# Patient Record
Sex: Female | Born: 1950 | Race: Black or African American | Hispanic: No | Smoking: Former smoker
Health system: Southern US, Community
[De-identification: ages and names within clinical notes are randomized; demographics above are authoritative.]

## PROBLEM LIST (undated history)

## (undated) DIAGNOSIS — I10 Essential (primary) hypertension: Secondary | ICD-10-CM

## (undated) DIAGNOSIS — E119 Type 2 diabetes mellitus without complications: Secondary | ICD-10-CM

## (undated) DIAGNOSIS — E079 Disorder of thyroid, unspecified: Secondary | ICD-10-CM

## (undated) HISTORY — PX: OTHER SURGICAL HISTORY: SHX169

## (undated) HISTORY — DX: Essential (primary) hypertension: I10

## (undated) HISTORY — DX: Disorder of thyroid, unspecified: E07.9

## (undated) HISTORY — DX: Type 2 diabetes mellitus without complications: E11.9

---

## 2019-06-27 LAB — BASIC METABOLIC PANEL
BUN: 35 — AB (ref 4–21)
Creatinine: 1.8 — AB (ref 0.5–1.1)

## 2019-06-27 LAB — LIPID PANEL
Cholesterol: 155 (ref 0–200)
HDL: 57 (ref 35–70)
LDL Cholesterol: 85
Triglycerides: 63 (ref 40–160)

## 2019-06-27 LAB — COMPREHENSIVE METABOLIC PANEL
GFR calc Af Amer: 32
GFR calc non Af Amer: 28

## 2019-06-27 LAB — HEMOGLOBIN A1C: Hemoglobin A1C: 7.6

## 2019-06-27 LAB — TSH: TSH: 7.3 — AB (ref 0.41–5.90)

## 2019-09-11 ENCOUNTER — Encounter: Payer: Self-pay | Admitting: "Endocrinology

## 2019-09-11 ENCOUNTER — Other Ambulatory Visit: Payer: Self-pay

## 2019-09-11 ENCOUNTER — Ambulatory Visit (INDEPENDENT_AMBULATORY_CARE_PROVIDER_SITE_OTHER): Payer: Medicare Other | Admitting: "Endocrinology

## 2019-09-11 VITALS — BP 136/78 | HR 80 | Ht 63.0 in | Wt 175.0 lb

## 2019-09-11 DIAGNOSIS — E1165 Type 2 diabetes mellitus with hyperglycemia: Secondary | ICD-10-CM | POA: Insufficient documentation

## 2019-09-11 DIAGNOSIS — E782 Mixed hyperlipidemia: Secondary | ICD-10-CM

## 2019-09-11 DIAGNOSIS — I10 Essential (primary) hypertension: Secondary | ICD-10-CM

## 2019-09-11 DIAGNOSIS — E032 Hypothyroidism due to medicaments and other exogenous substances: Secondary | ICD-10-CM | POA: Diagnosis not present

## 2019-09-11 LAB — HM DIABETES EYE EXAM

## 2019-09-11 NOTE — Progress Notes (Signed)
Endocrinology Consult Note       09/11/2019, 5:29 PM   Subjective:    Patient ID: Gloria Webb, female    DOB: July 15, 1950.  Gloria Webb is being seen in consultation for management of currently uncontrolled symptomatic diabetes requested by  Grayland Jack, PA-C.   Past Medical History:  Diagnosis Date  . Diabetes mellitus, type II (HCC)   . Hypertension   . Thyroid disease     Past Surgical History:  Procedure Laterality Date  . broken ankle      Social History   Socioeconomic History  . Marital status: Divorced    Spouse name: Not on file  . Number of children: Not on file  . Years of education: Not on file  . Highest education level: Not on file  Occupational History  . Not on file  Tobacco Use  . Smoking status: Never Smoker  Vaping Use  . Vaping Use: Never used  Substance and Sexual Activity  . Alcohol use: Never  . Drug use: Never  . Sexual activity: Not on file  Other Topics Concern  . Not on file  Social History Narrative  . Not on file   Social Determinants of Health   Financial Resource Strain:   . Difficulty of Paying Living Expenses:   Food Insecurity:   . Worried About Programme researcher, broadcasting/film/video in the Last Year:   . Barista in the Last Year:   Transportation Needs:   . Freight forwarder (Medical):   Marland Kitchen Lack of Transportation (Non-Medical):   Physical Activity:   . Days of Exercise per Week:   . Minutes of Exercise per Session:   Stress:   . Feeling of Stress :   Social Connections:   . Frequency of Communication with Friends and Family:   . Frequency of Social Gatherings with Friends and Family:   . Attends Religious Services:   . Active Member of Clubs or Organizations:   . Attends Banker Meetings:   Marland Kitchen Marital Status:     Family History  Problem Relation Age of Onset  . Hypertension Mother     Outpatient Encounter  Medications as of 09/11/2019  Medication Sig  . amLODipine (NORVASC) 5 MG tablet Take 5 mg by mouth daily.  Marland Kitchen atorvastatin (LIPITOR) 20 MG tablet Take 20 mg by mouth daily.  . carvedilol (COREG) 25 MG tablet Take 25 mg by mouth 2 (two) times daily.  . hydrALAZINE (APRESOLINE) 50 MG tablet Take 50 mg by mouth 2 (two) times daily.  Marland Kitchen LEVEMIR FLEXTOUCH 100 UNIT/ML FlexPen Inject 24 Units into the skin daily.  Marland Kitchen losartan (COZAAR) 25 MG tablet Take 25 mg by mouth daily.  Marland Kitchen NOVOLOG FLEXPEN 100 UNIT/ML FlexPen Inject 10 Units into the skin 3 (three) times daily.   No facility-administered encounter medications on file as of 09/11/2019.    ALLERGIES: Allergies  Allergen Reactions  . Methimazole Swelling    Pt states she experienced swelling of her face and tongue    VACCINATION STATUS:  There is no immunization history on file for this patient.  Diabetes She presents  for her initial diabetic visit. She has type 2 diabetes mellitus. Onset time: She was diagnosed at approximate age of 35 years. Her disease course has been fluctuating. There are no hypoglycemic associated symptoms. Pertinent negatives for hypoglycemia include no confusion, headaches, pallor or seizures. Associated symptoms include fatigue and polydipsia. Pertinent negatives for diabetes include no chest pain, no polyphagia and no polyuria. There are no hypoglycemic complications. Symptoms are worsening. Diabetic complications include nephropathy. Risk factors for coronary artery disease include diabetes mellitus, dyslipidemia, family history, obesity, hypertension, sedentary lifestyle and post-menopausal. Current diabetic treatment includes insulin injections (She is currently on Levemir 34 units nightly, NovoLog 10 units 3 times daily AC.). Her weight is fluctuating minimally. She is following a generally unhealthy diet. When asked about meal planning, she reported none. She has not had a previous visit with a dietitian. She never  participates in exercise. (She did not bring any logs nor meter to review.  She reports rare, random hypoglycemia.  Her recent labs show A1c of 7.6%.  She did have A1c as high as 12% in the past.) An ACE inhibitor/angiotensin II receptor blocker is being taken. Eye exam is current.  Hyperlipidemia This is a chronic problem. The current episode started more than 1 year ago. The problem is controlled. Exacerbating diseases include chronic renal disease, diabetes and obesity. Pertinent negatives include no chest pain, myalgias or shortness of breath. Current antihyperlipidemic treatment includes statins. Risk factors for coronary artery disease include diabetes mellitus, hypertension, obesity, a sedentary lifestyle, post-menopausal and family history.  Hypertension This is a chronic problem. The current episode started more than 1 year ago. Pertinent negatives include no chest pain, headaches, palpitations or shortness of breath. Risk factors for coronary artery disease include dyslipidemia, diabetes mellitus, obesity, sedentary lifestyle, family history and post-menopausal state. Past treatments include angiotensin blockers, beta blockers, calcium channel blockers and direct vasodilators. Identifiable causes of hypertension include chronic renal disease.     Review of Systems  Constitutional: Positive for fatigue. Negative for chills, fever and unexpected weight change.  HENT: Negative for trouble swallowing and voice change.   Eyes: Negative for visual disturbance.  Respiratory: Negative for cough, shortness of breath and wheezing.   Cardiovascular: Negative for chest pain, palpitations and leg swelling.  Gastrointestinal: Negative for diarrhea, nausea and vomiting.  Endocrine: Positive for polydipsia. Negative for cold intolerance, heat intolerance, polyphagia and polyuria.  Musculoskeletal: Negative for arthralgias and myalgias.  Skin: Negative for color change, pallor, rash and wound.   Neurological: Negative for seizures and headaches.  Psychiatric/Behavioral: Negative for confusion and suicidal ideas.    Objective:    Vitals with BMI 09/11/2019  Height 5\' 3"   Weight 175 lbs  BMI 31.01  Systolic 136  Diastolic 78  Pulse 80    BP 136/78   Pulse 80   Ht 5\' 3"  (1.6 m)   Wt 175 lb (79.4 kg)   BMI 31.00 kg/m   Wt Readings from Last 3 Encounters:  09/11/19 175 lb (79.4 kg)     Physical Exam Constitutional:      Appearance: She is well-developed.  HENT:     Head: Normocephalic and atraumatic.  Neck:     Thyroid: No thyromegaly.     Trachea: No tracheal deviation.  Cardiovascular:     Rate and Rhythm: Normal rate.  Pulmonary:     Effort: Pulmonary effort is normal.  Abdominal:     Tenderness: There is no abdominal tenderness. There is no guarding.  Musculoskeletal:  General: Normal range of motion.     Cervical back: Normal range of motion and neck supple.  Skin:    General: Skin is warm and dry.     Coloration: Skin is not pale.     Findings: No erythema or rash.  Neurological:     Mental Status: She is alert and oriented to person, place, and time.     Cranial Nerves: No cranial nerve deficit.     Coordination: Coordination normal.     Deep Tendon Reflexes: Reflexes are normal and symmetric.  Psychiatric:        Judgment: Judgment normal.     CMP ( most recent) CMP     Component Value Date/Time   BUN 35 (A) 06/27/2019 0000   CREATININE 1.8 (A) 06/27/2019 0000   GFRNONAA 28 06/27/2019 0000   GFRAA 32 06/27/2019 0000     Diabetic Labs (most recent): Lab Results  Component Value Date   HGBA1C 7.6 06/27/2019     Lipid Panel ( most recent) Lipid Panel     Component Value Date/Time   CHOL 155 06/27/2019 0000   TRIG 63 06/27/2019 0000   HDL 57 06/27/2019 0000   LDLCALC 85 06/27/2019 0000      Lab Results  Component Value Date   TSH 7.30 (A) 06/27/2019           Assessment & Plan:   1. Uncontrolled type 2  diabetes mellitus with hyperglycemia (HCC)  - Gloria Webb has currently uncontrolled symptomatic type 2 DM since  69 years of age,  with most recent A1c of 7.6 %. Recent labs reviewed. - I had a long discussion with her about the progressive nature of diabetes and the pathology behind its complications. -her diabetes is complicated by CKD, obesity/sedentary life and she remains at a high risk for more acute and chronic complications which include CAD, CVA, CKD, retinopathy, and neuropathy. These are all discussed in detail with her.  - I have counseled her on diet  and weight management  by adopting a carbohydrate restricted/protein rich diet. Patient is encouraged to switch to  unprocessed or minimally processed     complex starch and increased protein intake (animal or plant source), fruits, and vegetables. -  she is advised to stick to a routine mealtimes to eat 3 meals  a day and avoid unnecessary snacks ( to snack only to correct hypoglycemia).   - she admits that there is a room for improvement in her food and drink choices. - Suggestion is made for her to avoid simple carbohydrates  from her diet including Cakes, Sweet Desserts, Ice Cream, Soda (diet and regular), Sweet Tea, Candies, Chips, Cookies, Store Bought Juices, Alcohol in Excess of  1-2 drinks a day, Artificial Sweeteners,  Coffee Creamer, and "Sugar-free" Products. This will help patient to have more stable blood glucose profile and potentially avoid unintended weight gain.  - she will be scheduled with Norm Salt, RDN, CDE for diabetes education.  - I have approached her with the following individualized plan to manage  her diabetes and patient agrees:   - she will be continued on adjusted basal/bolus insulin in order for her to achieve and maintain control of diabetes to target. -Accordingly, she is advised to lower her Levemir to 30 units nightly, adjust her NovoLog at 10  units 3 times a day with meals  for pre-meal BG  readings of 90-150mg /dl, plus patient specific correction dose for unexpected hyperglycemia above 150mg /dl, associated with strict  monitoring of glucose 4 times a day-before meals and at bedtime. - she is warned not to take insulin without proper monitoring per orders. - Adjustment parameters are given to her for hypo and hyperglycemia in writing. - she is encouraged to call clinic for blood glucose levels less than 70 or above 300 mg /dl. -She will be considered for low-dose glipizide, or incretin therapy as appropriate during her next visit.  - Specific targets for  A1c;  LDL, HDL,  and Triglycerides were discussed with the patient.  2) Blood Pressure /Hypertension:  her blood pressure is  controlled to target.   she is advised to continue her current medications including losartan 25 mg p.o. daily, carvedilol 25 mg p.o. twice daily, amlodipine 5 mg daily, hydralazine 50 mg p.o. twice daily.    3) Lipids/Hyperlipidemia:   Review of her recent lipid panel showed  controlled  LDL at 85 .  she  is advised to continue    atorvastatin 20 mg daily at bedtime.  Side effects and precautions discussed with her.  4)  Weight/Diet:  Body mass index is 31 kg/m.  -   clearly complicating her diabetes care.   she is  a candidate for weight loss. I discussed with her the fact that loss of 5 - 10% of her  current body weight will have the most impact on her diabetes management.  Exercise, and detailed carbohydrates information provided  -  detailed on discharge instructions.  5) toxic multinodular goiter: -Her medical records were reviewed.  She underwent bilateral fine-needle aspiration of nodules on both lobes of the thyroid with benign findings.  She was treated adequately with antithyroid medications for hyperthyroidism.  Her most recent labs indicate iatrogenic hypothyroidism.  She is advised to stay off of her antithyroid intervention for now with plan to repeat thyroid function test in 5 weeks.  6)  Chronic Care/Health Maintenance:  -she  is on ACEI/ARB and Statin medications and  is encouraged to initiate and continue to follow up with Ophthalmology, Dentist,  Podiatrist at least yearly or according to recommendations, and advised to   stay away from smoking. I have recommended yearly flu vaccine and pneumonia vaccine at least every 5 years; moderate intensity exercise for up to 150 minutes weekly; and  sleep for at least 7 hours a day.  - she is  advised to maintain close follow up with Grayland Jack, PA-C for primary care needs, as well as her other providers for optimal and coordinated care.   - Time spent in this patient care: 60 min, of which > 50% was spent in  counseling  her about her currently uncontrolled type 2 diabetes, toxic multinodular goiter, hyperlipidemia, hypertension and the rest reviewing her blood glucose logs , discussing her hypoglycemia and hyperglycemia episodes, reviewing her current and  previous labs / studies  ( including abstraction from other facilities) and medications  doses and developing a  long term treatment plan based on the latest standards of care/ guidelines; and documenting her care.    Please refer to Patient Instructions for Blood Glucose Monitoring and Insulin/Medications Dosing Guide"  in media tab for additional information. Please  also refer to " Patient Self Inventory" in the Media  tab for reviewed elements of pertinent patient history.  Gloria Webb participated in the discussions, expressed understanding, and voiced agreement with the above plans.  All questions were answered to her satisfaction. she is encouraged to contact clinic should she have any questions or concerns  prior to her return visit.   Follow up plan: - Return in about 5 weeks (around 10/16/2019) for Bring Meter and Logs- A1c in Office.  Gloria LunchGebre Zelta Enfield, MD [facility] [address] [place], [place] [address] Phone:  [phone]  Fax: [phone]    09/11/2019, 5:29 PM  This note was partially dictated with voice recognition software. Similar sounding words can be transcribed inadequately or may not  be corrected upon review.

## 2019-09-11 NOTE — Patient Instructions (Signed)

## 2019-09-17 ENCOUNTER — Ambulatory Visit: Payer: Medicare Other | Admitting: Nurse Practitioner

## 2019-09-17 ENCOUNTER — Other Ambulatory Visit: Payer: Self-pay

## 2019-09-17 DIAGNOSIS — E1165 Type 2 diabetes mellitus with hyperglycemia: Secondary | ICD-10-CM

## 2019-09-17 NOTE — Progress Notes (Signed)
Patient came into office with [facility] Device and sensor, Patient was shown how to apply the sensor, demonstrated proper technique of application, verbalized understanding.

## 2019-10-10 LAB — T4, FREE: Free T4: 1.72 ng/dL (ref 0.82–1.77)

## 2019-10-10 LAB — TSH: TSH: 0.062 u[IU]/mL — ABNORMAL LOW (ref 0.450–4.500)

## 2019-10-10 LAB — T3, FREE: T3, Free: 2.8 pg/mL (ref 2.0–4.4)

## 2019-10-20 ENCOUNTER — Other Ambulatory Visit: Payer: Self-pay

## 2019-10-20 ENCOUNTER — Encounter: Payer: Self-pay | Admitting: "Endocrinology

## 2019-10-20 ENCOUNTER — Ambulatory Visit (INDEPENDENT_AMBULATORY_CARE_PROVIDER_SITE_OTHER): Payer: Medicare Other | Admitting: "Endocrinology

## 2019-10-20 VITALS — BP 118/76 | HR 76 | Ht 63.0 in | Wt 176.4 lb

## 2019-10-20 DIAGNOSIS — E0520 Thyrotoxicosis with toxic multinodular goiter without thyrotoxic crisis or storm: Secondary | ICD-10-CM | POA: Diagnosis not present

## 2019-10-20 DIAGNOSIS — I10 Essential (primary) hypertension: Secondary | ICD-10-CM | POA: Diagnosis not present

## 2019-10-20 DIAGNOSIS — E1165 Type 2 diabetes mellitus with hyperglycemia: Secondary | ICD-10-CM | POA: Diagnosis not present

## 2019-10-20 DIAGNOSIS — E782 Mixed hyperlipidemia: Secondary | ICD-10-CM

## 2019-10-20 LAB — POCT GLYCOSYLATED HEMOGLOBIN (HGB A1C): Hemoglobin A1C: 7.2 % — AB (ref 4.0–5.6)

## 2019-10-20 NOTE — Patient Instructions (Signed)

## 2019-10-20 NOTE — Progress Notes (Signed)
10/20/2019, 2:15 PM   Endocrinology follow-up note  Subjective:    Patient ID: Gloria Webb, female    DOB: 04/27/1950.  Gloria Webb is being seen in follow-up after she was seen in consultation for management of currently uncontrolled symptomatic diabetes requested by  Grayland Jack, PA-C.   Past Medical History:  Diagnosis Date  . Diabetes mellitus, type II (HCC)   . Hypertension   . Thyroid disease     Past Surgical History:  Procedure Laterality Date  . broken ankle      Social History   Socioeconomic History  . Marital status: Divorced    Spouse name: Not on file  . Number of children: Not on file  . Years of education: Not on file  . Highest education level: Not on file  Occupational History  . Not on file  Tobacco Use  . Smoking status: Never Smoker  Vaping Use  . Vaping Use: Never used  Substance and Sexual Activity  . Alcohol use: Never  . Drug use: Never  . Sexual activity: Not on file  Other Topics Concern  . Not on file  Social History Narrative  . Not on file   Social Determinants of Health   Financial Resource Strain:   . Difficulty of Paying Living Expenses: Not on file  Food Insecurity:   . Worried About Programme researcher, broadcasting/film/video in the Last Year: Not on file  . Ran Out of Food in the Last Year: Not on file  Transportation Needs:   . Lack of Transportation (Medical): Not on file  . Lack of Transportation (Non-Medical): Not on file  Physical Activity:   . Days of Exercise per Week: Not on file  . Minutes of Exercise per Session: Not on file  Stress:   . Feeling of Stress : Not on file  Social Connections:   . Frequency of Communication with Friends and Family: Not on file  . Frequency of Social Gatherings with Friends and Family: Not on file  . Attends Religious Services: Not on file  . Active Member of Clubs or Organizations: Not on file  . Attends Tax inspector Meetings: Not on file  . Marital Status: Not on file    Family History  Problem Relation Age of Onset  . Hypertension Mother     Outpatient Encounter Medications as of 10/20/2019  Medication Sig  . amLODipine (NORVASC) 5 MG tablet Take 5 mg by mouth daily.  Marland Kitchen atorvastatin (LIPITOR) 20 MG tablet Take 20 mg by mouth daily.  . carvedilol (COREG) 25 MG tablet Take 25 mg by mouth 2 (two) times daily.  . hydrALAZINE (APRESOLINE) 50 MG tablet Take 50 mg by mouth 2 (two) times daily.  Marland Kitchen LEVEMIR FLEXTOUCH 100 UNIT/ML FlexPen Inject 24 Units into the skin daily.  Marland Kitchen losartan (COZAAR) 25 MG tablet Take 25 mg by mouth daily.  Marland Kitchen NOVOLOG FLEXPEN 100 UNIT/ML FlexPen Inject 10 Units into the skin 3 (three) times daily.   No facility-administered encounter medications on file as of 10/20/2019.    ALLERGIES: Allergies  Allergen Reactions  . Methimazole Swelling    Pt states she experienced swelling of her face and tongue  VACCINATION STATUS:  There is no immunization history on file for this patient.  Diabetes She presents for her follow-up diabetic visit. She has type 2 diabetes mellitus. Onset time: She was diagnosed at approximate age of 35 years. Her disease course has been improving. There are no hypoglycemic associated symptoms. Pertinent negatives for hypoglycemia include no confusion, headaches, pallor or seizures. Associated symptoms include fatigue and polydipsia. Pertinent negatives for diabetes include no chest pain, no polyphagia and no polyuria. There are no hypoglycemic complications. Symptoms are improving. Diabetic complications include nephropathy. Risk factors for coronary artery disease include diabetes mellitus, dyslipidemia, family history, obesity, hypertension, sedentary lifestyle and post-menopausal. Current diabetic treatment includes insulin injections (She is currently on Levemir 34 units nightly, NovoLog 10 units 3 times daily AC.). Her weight is fluctuating  minimally. She is following a generally unhealthy diet. When asked about meal planning, she reported none. She has not had a previous visit with a dietitian. She never participates in exercise. Her home blood glucose trend is decreasing steadily. (She presents her CGM device showing 77% time in range, 20% above range, 3% mild hypoglycemia.  Her point-of-care A1c is 7.2% improving from 7.6%.  ) An ACE inhibitor/angiotensin II receptor blocker is being taken. Eye exam is current.  Hyperlipidemia This is a chronic problem. The current episode started more than 1 year ago. The problem is controlled. Exacerbating diseases include chronic renal disease, diabetes and obesity. Pertinent negatives include no chest pain, myalgias or shortness of breath. Current antihyperlipidemic treatment includes statins. Risk factors for coronary artery disease include diabetes mellitus, hypertension, obesity, a sedentary lifestyle, post-menopausal and family history.  Hypertension This is a chronic problem. The current episode started more than 1 year ago. Pertinent negatives include no chest pain, headaches, palpitations or shortness of breath. Risk factors for coronary artery disease include dyslipidemia, diabetes mellitus, obesity, sedentary lifestyle, family history and post-menopausal state. Past treatments include angiotensin blockers, beta blockers, calcium channel blockers and direct vasodilators. Identifiable causes of hypertension include chronic renal disease.     Review of Systems  Constitutional: Positive for fatigue. Negative for chills, fever and unexpected weight change.  HENT: Negative for trouble swallowing and voice change.   Eyes: Negative for visual disturbance.  Respiratory: Negative for cough, shortness of breath and wheezing.   Cardiovascular: Negative for chest pain, palpitations and leg swelling.  Gastrointestinal: Negative for diarrhea, nausea and vomiting.  Endocrine: Positive for polydipsia.  Negative for cold intolerance, heat intolerance, polyphagia and polyuria.  Musculoskeletal: Negative for arthralgias and myalgias.  Skin: Negative for color change, pallor, rash and wound.  Neurological: Negative for seizures and headaches.  Psychiatric/Behavioral: Negative for confusion and suicidal ideas.    Objective:    Vitals with BMI 10/20/2019 09/11/2019  Height 5\' 3"  5\' 3"   Weight 176 lbs 6 oz 175 lbs  BMI 31.26 31.01  Systolic 118 136  Diastolic 76 78  Pulse 76 80    BP 118/76   Pulse 76   Ht 5\' 3"  (1.6 m)   Wt 176 lb 6.4 oz (80 kg)   BMI 31.25 kg/m   Wt Readings from Last 3 Encounters:  10/20/19 176 lb 6.4 oz (80 kg)  09/11/19 175 lb (79.4 kg)     Physical Exam Constitutional:      Appearance: She is well-developed.  HENT:     Head: Normocephalic and atraumatic.  Neck:     Thyroid: No thyromegaly.     Trachea: No tracheal deviation.  Cardiovascular:  Rate and Rhythm: Normal rate.  Pulmonary:     Effort: Pulmonary effort is normal.  Abdominal:     Tenderness: There is no abdominal tenderness. There is no guarding.  Musculoskeletal:        General: Normal range of motion.     Cervical back: Normal range of motion and neck supple.  Skin:    General: Skin is warm and dry.     Coloration: Skin is not pale.     Findings: No erythema or rash.  Neurological:     Mental Status: She is alert and oriented to person, place, and time.     Cranial Nerves: No cranial nerve deficit.     Coordination: Coordination normal.     Deep Tendon Reflexes: Reflexes are normal and symmetric.  Psychiatric:        Judgment: Judgment normal.     CMP ( most recent) CMP     Component Value Date/Time   BUN 35 (A) 06/27/2019 0000   CREATININE 1.8 (A) 06/27/2019 0000   GFRNONAA 28 06/27/2019 0000   GFRAA 32 06/27/2019 0000     Diabetic Labs (most recent): Lab Results  Component Value Date   HGBA1C 7.2 (A) 10/20/2019   HGBA1C 7.6 06/27/2019     Lipid Panel ( most  recent) Lipid Panel     Component Value Date/Time   CHOL 155 06/27/2019 0000   TRIG 63 06/27/2019 0000   HDL 57 06/27/2019 0000   LDLCALC 85 06/27/2019 0000      Lab Results  Component Value Date   TSH 0.062 (L) 10/09/2019   TSH 7.30 (A) 06/27/2019   FREET4 1.72 10/09/2019      Assessment & Plan:   1. Uncontrolled type 2 diabetes mellitus with hyperglycemia (HCC)  - Gloria Webb has currently uncontrolled symptomatic type 2 DM since  69 years of age.  She presents her CGM device showing 77% time in range, 20% above range, 3% mild hypoglycemia.  Her point-of-care A1c is 7.2% improving from 7.6%.     Recent labs reviewed. - I had a long discussion with her about the progressive nature of diabetes and the pathology behind its complications. -her diabetes is complicated by CKD, obesity/sedentary life and she remains at a high risk for more acute and chronic complications which include CAD, CVA, CKD, retinopathy, and neuropathy. These are all discussed in detail with her.  - I have counseled her on diet  and weight management  by adopting a carbohydrate restricted/protein rich diet. Patient is encouraged to switch to  unprocessed or minimally processed     complex starch and increased protein intake (animal or plant source), fruits, and vegetables. -  she is advised to stick to a routine mealtimes to eat 3 meals  a day and avoid unnecessary snacks ( to snack only to correct hypoglycemia).   - she  admits there is a room for improvement in her diet and drink choices. -  Suggestion is made for her to avoid simple carbohydrates  from her diet including Cakes, Sweet Desserts / Pastries, Ice Cream, Soda (diet and regular), Sweet Tea, Candies, Chips, Cookies, Sweet Pastries,  Store Bought Juices, Alcohol in Excess of  1-2 drinks a day, Artificial Sweeteners, Coffee Creamer, and "Sugar-free" Products. This will help patient to have stable blood glucose profile and potentially avoid unintended  weight gain.  - she will be scheduled with Norm Salt, RDN, CDE for diabetes education.  - I have approached her with the following  individualized plan to manage  her diabetes and patient agrees:   - she will be continued on adjusted basal/bolus insulin in order for her to achieve and maintain control of diabetes to target. -Accordingly, she is advised to increase Levemir to 34 units nightly, continue NovoLog 10   units 3 times a day with meals  for pre-meal BG readings of 90-150mg /dl, plus patient specific correction dose for unexpected hyperglycemia above 150mg /dl, associated with strict monitoring of glucose 4 times a day-before meals and at bedtime. - she is warned not to take insulin without proper monitoring per orders. - Adjustment parameters are given to her for hypo and hyperglycemia in writing. - she is encouraged to call clinic for blood glucose levels less than 70 or above 300 mg /dl. -She will be considered for low-dose glipizide, or incretin therapy as appropriate during her next visit.  - Specific targets for  A1c;  LDL, HDL,  and Triglycerides were discussed with the patient.  2) Blood Pressure /Hypertension:  Her blood pressure is controlled to target. she is advised to continue her current medications including losartan 25 mg p.o. daily, carvedilol 25 mg p.o. twice daily, amlodipine 5 mg daily, hydralazine 50 mg p.o. twice daily.    3) Lipids/Hyperlipidemia:   Review of her recent lipid panel showed  controlled  LDL at 85 .  she  is advised to continue atorvastatin 20 mg p.o. daily at bedtime.  Side effects and precautions discussed with her.  4)  Weight/Diet:  Body mass index is 31.25 kg/m.  -   clearly complicating her diabetes care.   she is  a candidate for weight loss. I discussed with her the fact that loss of 5 - 10% of her  current body weight will have the most impact on her diabetes management.  Exercise, and detailed carbohydrates information provided  -   detailed on discharge instructions.  5) toxic multinodular goiter: -Her medical records were reviewed.  She underwent bilateral fine-needle aspiration of nodules on both lobes of the thyroid with benign findings.  She was treated adequately with what appears to be methimazole until 5 months ago.   Her previsit labs are consistent with likely relapse of hyperthyroidism.  She will need thyroid uptake and scan, will be called if her findings are abnormal.    6) Chronic Care/Health Maintenance:  -she  is on ACEI/ARB and Statin medications and  is encouraged to initiate and continue to follow up with Ophthalmology, Dentist,  Podiatrist at least yearly or according to recommendations, and advised to   stay away from smoking. I have recommended yearly flu vaccine and pneumonia vaccine at least every 5 years; moderate intensity exercise for up to 150 minutes weekly; and  sleep for at least 7 hours a day.  - she is  advised to maintain close follow up with , PA-C for primary care needs, as well as her other providers for optimal and coordinated care.   - Time spent on this patient care encounter:  45 min, of which > 50% was spent in  counseling and the rest reviewing her blood glucose logs , discussing her hypoglycemia and hyperglycemia episodes, reviewing her current and  previous labs / studies  ( including abstraction from other facilities) and medications  doses and developing a  long term treatment plan and documenting her care.   Please refer to Patient Instructions for Blood Glucose Monitoring and Insulin/Medications Dosing Guide"  in media tab for additional information. Please  also  refer to " Patient Self Inventory" in the Media  tab for reviewed elements of pertinent patient history.  Gloria Webb participated in the discussions, expressed understanding, and voiced agreement with the above plans.  All questions were answered to her satisfaction. she is encouraged to contact  clinic should she have any questions or concerns prior to her return visit.   Follow up plan: - Return in about 3 months (around 01/19/2020), or she will do her uptake anytime, for F/U with Pre-visit Labs, Meter, Logs, A1c here., F/U with Thyroid Uptake and Scan.  Marquis Lunch, MD [facility] [address] [place], [place] [address] Phone: [phone]  Fax: [phone]    10/20/2019, 2:15 PM  This note was partially dictated with voice recognition software. Similar sounding words can be transcribed inadequately or may not  be corrected upon review.

## 2019-11-04 ENCOUNTER — Encounter (HOSPITAL_COMMUNITY)
Admission: RE | Admit: 2019-11-04 | Discharge: 2019-11-04 | Disposition: A | Discharge status: Home / Self Care | Payer: Medicare Other | Source: Ambulatory Visit | Attending: "Endocrinology | Admitting: "Endocrinology

## 2019-11-04 ENCOUNTER — Other Ambulatory Visit: Payer: Self-pay

## 2019-11-04 ENCOUNTER — Other Ambulatory Visit (HOSPITAL_COMMUNITY): Payer: PRIVATE HEALTH INSURANCE

## 2019-11-04 DIAGNOSIS — E0520 Thyrotoxicosis with toxic multinodular goiter without thyrotoxic crisis or storm: Secondary | ICD-10-CM | POA: Insufficient documentation

## 2019-11-04 MED ORDER — SODIUM IODIDE I 131 CAPSULE
18.0000 | Freq: Once | INTRAVENOUS | Status: AC | PRN
  Administered 2019-11-04: 18 via ORAL

## 2019-11-05 ENCOUNTER — Encounter (HOSPITAL_COMMUNITY)
Admission: RE | Admit: 2019-11-05 | Discharge: 2019-11-05 | Disposition: A | Discharge status: Home / Self Care | Payer: Medicare Other | Source: Ambulatory Visit | Attending: "Endocrinology | Admitting: "Endocrinology

## 2019-11-05 ENCOUNTER — Other Ambulatory Visit (HOSPITAL_COMMUNITY): Payer: PRIVATE HEALTH INSURANCE

## 2019-11-05 MED ORDER — SODIUM PERTECHNETATE TC 99M INJECTION
10.0000 | Freq: Once | INTRAVENOUS | Status: AC | PRN
  Administered 2019-11-05: 9 via INTRAVENOUS

## 2019-11-06 ENCOUNTER — Other Ambulatory Visit (HOSPITAL_COMMUNITY): Payer: PRIVATE HEALTH INSURANCE

## 2019-11-27 ENCOUNTER — Other Ambulatory Visit: Payer: Self-pay | Admitting: "Endocrinology

## 2019-12-30 ENCOUNTER — Other Ambulatory Visit (HOSPITAL_COMMUNITY)
Admission: RE | Admit: 2019-12-30 | Discharge: 2019-12-30 | Disposition: A | Discharge status: Home / Self Care | Payer: Medicare Other | Source: Ambulatory Visit | Attending: "Endocrinology | Admitting: "Endocrinology

## 2019-12-30 ENCOUNTER — Other Ambulatory Visit: Payer: Self-pay

## 2019-12-30 DIAGNOSIS — E1165 Type 2 diabetes mellitus with hyperglycemia: Secondary | ICD-10-CM | POA: Insufficient documentation

## 2019-12-30 DIAGNOSIS — E0520 Thyrotoxicosis with toxic multinodular goiter without thyrotoxic crisis or storm: Secondary | ICD-10-CM | POA: Diagnosis present

## 2019-12-30 LAB — COMPREHENSIVE METABOLIC PANEL
ALT: 21 U/L (ref 0–44)
AST: 22 U/L (ref 15–41)
Albumin: 3.2 g/dL — ABNORMAL LOW (ref 3.5–5.0)
Alkaline Phosphatase: 90 U/L (ref 38–126)
Anion gap: 8 (ref 5–15)
BUN: 34 mg/dL — ABNORMAL HIGH (ref 8–23)
CO2: 25 mmol/L (ref 22–32)
Calcium: 9.1 mg/dL (ref 8.9–10.3)
Chloride: 108 mmol/L (ref 98–111)
Creatinine, Ser: 1.8 mg/dL — ABNORMAL HIGH (ref 0.44–1.00)
GFR, Estimated: 30 mL/min — ABNORMAL LOW (ref >60)
Glucose, Bld: 91 mg/dL (ref 70–99)
Potassium: 4.1 mmol/L (ref 3.5–5.1)
Sodium: 141 mmol/L (ref 135–145)
Total Bilirubin: 0.6 mg/dL (ref 0.3–1.2)
Total Protein: 6.9 g/dL (ref 6.5–8.1)

## 2019-12-30 LAB — TSH: TSH: 0.265 u[IU]/mL — ABNORMAL LOW (ref 0.350–4.500)

## 2019-12-30 LAB — T4, FREE: Free T4: 1.01 ng/dL (ref 0.61–1.12)

## 2019-12-31 LAB — T3, FREE: T3, Free: 2.8 pg/mL (ref 2.0–4.4)

## 2020-01-20 ENCOUNTER — Encounter: Payer: Self-pay | Admitting: "Endocrinology

## 2020-01-20 ENCOUNTER — Ambulatory Visit (INDEPENDENT_AMBULATORY_CARE_PROVIDER_SITE_OTHER): Payer: Medicare Other | Admitting: "Endocrinology

## 2020-01-20 ENCOUNTER — Other Ambulatory Visit: Payer: Self-pay

## 2020-01-20 VITALS — BP 142/86 | HR 65 | Ht 63.0 in | Wt 162.5 lb

## 2020-01-20 DIAGNOSIS — E1165 Type 2 diabetes mellitus with hyperglycemia: Secondary | ICD-10-CM | POA: Diagnosis not present

## 2020-01-20 DIAGNOSIS — E0520 Thyrotoxicosis with toxic multinodular goiter without thyrotoxic crisis or storm: Secondary | ICD-10-CM

## 2020-01-20 DIAGNOSIS — E782 Mixed hyperlipidemia: Secondary | ICD-10-CM | POA: Diagnosis not present

## 2020-01-20 LAB — POCT GLYCOSYLATED HEMOGLOBIN (HGB A1C): HbA1c, POC (controlled diabetic range): 6.8 % (ref 0.0–7.0)

## 2020-01-20 MED ORDER — GLIPIZIDE ER 2.5 MG PO TB24: 2.5000 mg | ORAL_TABLET | Freq: Every day | ORAL | 1 refills | Status: DC

## 2020-01-20 NOTE — Patient Instructions (Signed)
                                     Advice for Weight Management  -For most of us the best way to lose weight is by diet management. Generally speaking, diet management means consuming less calories intentionally which over time brings about progressive weight loss.  This can be achieved more effectively by restricting carbohydrate consumption to the minimum possible.  So, it is critically important to know your numbers: how much calorie you are consuming and how much calorie you need. More importantly, our carbohydrates sources should be unprocessed or minimally processed complex starch food items.   Sometimes, it is important to balance nutrition by increasing protein intake (animal or plant source), fruits, and vegetables.  -Sticking to a routine mealtime to eat 3 meals a day and avoiding unnecessary snacks is shown to have a big role in weight control. Under normal circumstances, the only time we lose real weight is when we are hungry, so allow hunger to take place- hunger means no food between meal times, only water.  It is not advisable to starve.   -It is better to avoid simple carbohydrates including: Cakes, Sweet Desserts, Ice Cream, Soda (diet and regular), Sweet Tea, Candies, Chips, Cookies, Store Bought Juices, Alcohol in Excess of  1-2 drinks a day, Lemonade,  Artificial Sweeteners, Doughnuts, Coffee Creamers, "Sugar-free" Products, etc, etc.  This is not a complete list.....    -Consulting with certified diabetes educators is proven to provide you with the most accurate and current information on diet.  Also, you may be  interested in discussing diet options/exchanges , we can schedule a visit with Gloria Webb, RDN, CDE for individualized nutrition education.  -Exercise: If you are able: 30 -60 minutes a day ,4 days a week, or 150 minutes a week.  The longer the better.  Combine stretch, strength, and aerobic activities.  If you were told in the  past that you have high risk for cardiovascular diseases, you may seek evaluation by your heart doctor prior to initiating moderate to intense exercise programs.                                  Additional Care Considerations for Diabetes   -Diabetes  is a chronic disease.  The most important care consideration is regular follow-up with your diabetes care provider with the goal being avoiding or delaying its complications and to take advantage of advances in medications and technology.    -Type 2 diabetes is known to coexist with other important comorbidities such as high blood pressure and high cholesterol.  It is critical to control not only the diabetes but also the high blood pressure and high cholesterol to minimize and delay the risk of complications including coronary artery disease, stroke, amputations, blindness, etc.    - Studies showed that people with diabetes will benefit from a class of medications known as ACE inhibitors and statins.  Unless there are specific reasons not to be on these medications, the standard of care is to consider getting one from these groups of medications at an optimal doses.  These medications are generally considered safe and proven to help protect the heart and the kidneys.    - People with diabetes are encouraged to initiate and maintain regular follow-up with eye doctors, foot   doctors, dentists , and if necessary heart and kidney doctors.     - It is highly recommended that people with diabetes quit smoking or stay away from smoking, and get yearly  flu vaccine and pneumonia vaccine at least every 5 years.  One other important lifestyle recommendation is to ensure adequate sleep - at least 6-7 hours of uninterrupted sleep at night.  -Exercise: If you are able: 30 -60 minutes a day, 4 days a week, or 150 minutes a week.  The longer the better.  Combine stretch, strength, and aerobic activities.  If you were told in the past that you have high risk for  cardiovascular diseases, you may seek evaluation by your heart doctor prior to initiating moderate to intense exercise programs.          

## 2020-01-20 NOTE — Progress Notes (Signed)
01/20/2020, 5:19 PM   Endocrinology follow-up note  Subjective:    Patient ID: Gloria Webb, female    DOB: May 01, 1950.  Gloria Webb is being seen in follow-up after she was seen in consultation for management of type 2 diabetes, hyperlipidemia, hypertension. PCP: Grayland Jackichardson, Jennifer, PA-C.   Past Medical History:  Diagnosis Date  . Diabetes mellitus, type II (HCC)   . Hypertension   . Thyroid disease     Past Surgical History:  Procedure Laterality Date  . broken ankle      Social History   Socioeconomic History  . Marital status: Divorced    Spouse name: Not on file  . Number of children: Not on file  . Years of education: Not on file  . Highest education level: Not on file  Occupational History  . Not on file  Tobacco Use  . Smoking status: Never Smoker  . Smokeless tobacco: Not on file  Vaping Use  . Vaping Use: Never used  Substance and Sexual Activity  . Alcohol use: Never  . Drug use: Never  . Sexual activity: Not on file  Other Topics Concern  . Not on file  Social History Narrative  . Not on file   Social Determinants of Health   Financial Resource Strain: Not on file  Food Insecurity: Not on file  Transportation Needs: Not on file  Physical Activity: Not on file  Stress: Not on file  Social Connections: Not on file    Family History  Problem Relation Age of Onset  . Hypertension Mother     Outpatient Encounter Medications as of 01/20/2020  Medication Sig  . amLODipine (NORVASC) 5 MG tablet Take 5 mg by mouth daily.  Marland Kitchen. atorvastatin (LIPITOR) 20 MG tablet Take 20 mg by mouth daily.  . carvedilol (COREG) 25 MG tablet Take 25 mg by mouth 2 (two) times daily.  Marland Kitchen. glipiZIDE (GLUCOTROL XL) 2.5 MG 24 hr tablet Take 1 tablet (2.5 mg total) by mouth daily with breakfast.  . hydrALAZINE (APRESOLINE) 50 MG tablet Take 50 mg by mouth 2 (two) times daily.  Marland Kitchen. LEVEMIR FLEXTOUCH  100 UNIT/ML FlexPen Inject 20 Units into the skin at bedtime.  Marland Kitchen. losartan (COZAAR) 25 MG tablet Take 25 mg by mouth daily.  . Vitamin D, Ergocalciferol, (DRISDOL) 1.25 MG (50000 UNIT) CAPS capsule Take 50,000 Units by mouth every 14 (fourteen) days.  . [DISCONTINUED] NOVOLOG FLEXPEN 100 UNIT/ML FlexPen Inject 5 Units into the skin 3 (three) times daily.   No facility-administered encounter medications on file as of 01/20/2020.    ALLERGIES: Allergies  Allergen Reactions  . Methimazole Swelling    Pt states she experienced swelling of her face and tongue    VACCINATION STATUS:  There is no immunization history on file for this patient.  Diabetes She presents for her follow-up diabetic visit. She has type 2 diabetes mellitus. Onset time: She was diagnosed at approximate age of 35 years. Her disease course has been improving. There are no hypoglycemic associated symptoms. Pertinent negatives for hypoglycemia include no confusion, headaches, pallor or seizures. Associated symptoms include fatigue. Pertinent negatives for diabetes include no chest pain, no polydipsia, no polyphagia and no polyuria. There  are no hypoglycemic complications. Symptoms are improving. Diabetic complications include nephropathy. Risk factors for coronary artery disease include diabetes mellitus, dyslipidemia, family history, obesity, hypertension, sedentary lifestyle and post-menopausal. Current diabetic treatment includes insulin injections. Her weight is decreasing steadily. She is following a generally unhealthy diet. When asked about meal planning, she reported none. She has not had a previous visit with a dietitian. She never participates in exercise. Her home blood glucose trend is decreasing steadily. (She presents with her CGM device showing 82% time range, 30% above range, 5% hypoglycemia.  Her point-of-care A1c is 6.8%, improving from 7.6%.    ) An ACE inhibitor/angiotensin II receptor blocker is being taken. Eye  exam is current.  Hyperlipidemia This is a chronic problem. The current episode started more than 1 year ago. The problem is controlled. Exacerbating diseases include chronic renal disease, diabetes and obesity. Pertinent negatives include no chest pain, myalgias or shortness of breath. Current antihyperlipidemic treatment includes statins. Risk factors for coronary artery disease include diabetes mellitus, hypertension, obesity, a sedentary lifestyle, post-menopausal and family history.  Hypertension This is a chronic problem. The current episode started more than 1 year ago. Pertinent negatives include no chest pain, headaches, palpitations or shortness of breath. Risk factors for coronary artery disease include dyslipidemia, diabetes mellitus, obesity, sedentary lifestyle, family history and post-menopausal state. Past treatments include angiotensin blockers, beta blockers, calcium channel blockers and direct vasodilators. Identifiable causes of hypertension include chronic renal disease.     Review of Systems  Constitutional: Positive for fatigue. Negative for chills, fever and unexpected weight change.  HENT: Negative for trouble swallowing and voice change.   Eyes: Negative for visual disturbance.  Respiratory: Negative for cough, shortness of breath and wheezing.   Cardiovascular: Negative for chest pain, palpitations and leg swelling.  Gastrointestinal: Negative for diarrhea, nausea and vomiting.  Endocrine: Negative for cold intolerance, heat intolerance, polydipsia, polyphagia and polyuria.  Musculoskeletal: Negative for arthralgias and myalgias.  Skin: Negative for color change, pallor, rash and wound.  Neurological: Negative for seizures and headaches.  Psychiatric/Behavioral: Negative for confusion and suicidal ideas.    Objective:    Vitals with BMI 01/20/2020 10/20/2019 09/11/2019  Height 5\' 3"  5\' 3"  5\' 3"   Weight 162 lbs 8 oz 176 lbs 6 oz 175 lbs  BMI 28.79 31.26 31.01   Systolic 142 118  Diastolic 86 76 78  Pulse 65 76 80    BP (!) 142/86   Pulse 65   Ht 5\' 3"  (1.6 m)   Wt 162 lb 8 oz (73.7 kg)   BMI 28.79 kg/m   Wt Readings from Last 3 Encounters:  01/20/20 162 lb 8 oz (73.7 kg)  10/20/19 176 lb 6.4 oz (80 kg)  09/11/19 175 lb (79.4 kg)     Physical Exam Constitutional:      Appearance: She is well-developed.  HENT:     Head: Normocephalic and atraumatic.  Neck:     Thyroid: No thyromegaly.     Trachea: No tracheal deviation.  Cardiovascular:     Rate and Rhythm: Normal rate.  Pulmonary:     Effort: Pulmonary effort is normal.  Abdominal:     Tenderness: There is no abdominal tenderness. There is no guarding.  Musculoskeletal:        General: Normal range of motion.     Cervical back: Normal range of motion and neck supple.  Skin:    General: Skin is warm and dry.     Coloration: Skin is  not pale.     Findings: No erythema or rash.  Neurological:     Mental Status: She is alert and oriented to person, place, and time.     Cranial Nerves: No cranial nerve deficit.     Coordination: Coordination normal.     Deep Tendon Reflexes: Reflexes are normal and symmetric.  Psychiatric:        Judgment: Judgment normal.     CMP ( most recent) CMP     Component Value Date/Time   NA 141 12/30/2019 1415   K 4.1 12/30/2019 1415   CL 108 12/30/2019 1415   CO2 25 12/30/2019 1415   GLUCOSE 91 12/30/2019 1415   BUN 34 (H) 12/30/2019 1415   BUN 35 (A) 06/27/2019 0000   CREATININE 1.80 (H) 12/30/2019 1415   CALCIUM 9.1 12/30/2019 1415   PROT 6.9 12/30/2019 1415   ALBUMIN 3.2 (L) 12/30/2019 1415   AST 22 12/30/2019 1415   ALT 21 12/30/2019 1415   ALKPHOS 90 12/30/2019 1415   BILITOT 0.6 12/30/2019 1415   GFRNONAA 30 (L) 12/30/2019 1415   GFRAA 32 06/27/2019 0000     Diabetic Labs (most recent): Lab Results  Component Value Date   HGBA1C 6.8 01/20/2020   HGBA1C 7.2 (A) 10/20/2019   HGBA1C 7.6 06/27/2019     Lipid Panel  ( most recent) Lipid Panel     Component Value Date/Time   CHOL 155 06/27/2019 0000   TRIG 63 06/27/2019 0000   HDL 57 06/27/2019 0000   LDLCALC 85 06/27/2019 0000      Lab Results  Component Value Date   TSH 0.265 (L) 12/30/2019   TSH 0.062 (L) 10/09/2019   TSH 7.30 (A) 06/27/2019   FREET4 1.01 12/30/2019   FREET4 1.72 10/09/2019      Assessment & Plan:   1. Uncontrolled type 2 diabetes mellitus with hyperglycemia (HCC)  - Ceazia Harb has currently uncontrolled symptomatic type 2 DM since  69 years of age.  She presents with her CGM device showing 82% time range, 30% above range, 5% hypoglycemia.  Her point-of-care A1c is 6.8%, improving from 7.6%.     Recent labs reviewed. - I had a long discussion with her about the progressive nature of diabetes and the pathology behind its complications. -her diabetes is complicated by CKD, obesity/sedentary life and she remains at a high risk for more acute and chronic complications which include CAD, CVA, CKD, retinopathy, and neuropathy. These are all discussed in detail with her.  - I have counseled her on diet  and weight management  by adopting a carbohydrate restricted/protein rich diet. Patient is encouraged to switch to  unprocessed or minimally processed     complex starch and increased protein intake (animal or plant source), fruits, and vegetables. -  she is advised to stick to a routine mealtimes to eat 3 meals  a day and avoid unnecessary snacks ( to snack only to correct hypoglycemia).   - she acknowledges that there is a room for improvement in her food and drink choices. - Suggestion is made for her to avoid simple carbohydrates  from her diet including Cakes, Sweet Desserts, Ice Cream, Soda (diet and regular), Sweet Tea, Candies, Chips, Cookies, Store Bought Juices, Alcohol in Excess of  1-2 drinks a day, Artificial Sweeteners,  Coffee Creamer, and "Sugar-free" Products, Lemonade. This will help patient to have more  stable blood glucose profile and potentially avoid unintended weight gain.   - she will be scheduled  with Norm Salt, RDN, CDE for diabetes education.  - I have approached her with the following individualized plan to manage  her diabetes and patient agrees:   -In light of her presentation with tightly controlled glycemic profile and improved A1c of 6.8%, she would benefit from de-escalation of treatment with insulin.   -Accordingly, she is advised to lower her Levemir to 20 units nightly, advised to stop NovoLog for now.  -To help her control her postprandial hyperglycemia, I discussed and added glipizide 2.5 mg p.o. daily at breakfast.  -She will continue to benefit from her CGM device, advised to use it at all times.    - she is warned not to take insulin without proper monitoring per orders.  - she is encouraged to call clinic for blood glucose levels less than 70 or above 200 mg /dl.   - Specific targets for  A1c;  LDL, HDL,  and Triglycerides were discussed with the patient.  2) Blood Pressure /Hypertension:  -Her blood pressure is near target. she is advised to continue her current medications including losartan 25 mg p.o. daily, carvedilol 25 mg p.o. twice daily, amlodipine 5 mg daily, hydralazine 50 mg p.o. twice daily.    3) Lipids/Hyperlipidemia:   Review of her recent lipid panel showed  controlled  LDL at 85 .  she  is advised to continue atorvastatin 20 mg p.o. daily at bedtime.  Side effects and precautions discussed with her.  4)  Weight/Diet:  Body mass index is 28.79 kg/m.  -   clearly complicating her diabetes care.   she is  a candidate for weight loss. I discussed with her the fact that loss of 5 - 10% of her  current body weight will have the most impact on her diabetes management.  Exercise, and detailed carbohydrates information provided  -  detailed on discharge instructions.  5) toxic multinodular goiter: -Her medical records were reviewed.  She underwent  bilateral fine-needle aspiration of nodules on both lobes of the thyroid with benign findings.  She was treated adequately with what appears to be methimazole until 5 months ago.  Her recent labs are consistent with euthyroid state.  And her 24-hour uptake was 25.3% on the background of enlarged multinodular thyroid lobe with multiple small hard nodules seen within both lobes consistent with hyperfunctional adenomas.   She will not need ablative treatment.  However she will be considered for thyroid ultrasound on next visit   6) Chronic Care/Health Maintenance:  -she  is on ACEI/ARB and Statin medications and  is encouraged to initiate and continue to follow up with Ophthalmology, Dentist,  Podiatrist at least yearly or according to recommendations, and advised to   stay away from smoking. I have recommended yearly flu vaccine and pneumonia vaccine at least every 5 years; moderate intensity exercise for up to 150 minutes weekly; and  sleep for at least 7 hours a day.  POCT ABI Results 01/20/20  Her ABI studies normal Right ABI: 1.06      left ABI: 1.06  Right leg systolic / diastolic: 151/87 mmHg Left leg systolic / diastolic: 150/87 mmHg  Arm systolic / diastolic: 142/86 mmHG This study will be repeated in December 2026, or sooner if needed. - she is  advised to maintain close follow up with Grayland Jack, PA-C for primary care needs, as well as her other providers for optimal and coordinated care.  - Time spent on this patient care encounter:  40 min, of which > 50%  was spent in  counseling and the rest reviewing her blood glucose logs , discussing her hypoglycemia and hyperglycemia episodes, reviewing her current and  previous labs / studies  ( including abstraction from other facilities) and medications  doses and developing a  long term treatment plan and documenting her care.   Please refer to Patient Instructions for Blood Glucose Monitoring and Insulin/Medications Dosing Guide"  in  media tab for additional information. Please  also refer to " Patient Self Inventory" in the Media  tab for reviewed elements of pertinent patient history.  Gloria Webb participated in the discussions, expressed understanding, and voiced agreement with the above plans.  All questions were answered to her satisfaction. she is encouraged to contact clinic should she have any questions or concerns prior to her return visit.    Follow up plan: - Return in about 4 months (around 05/20/2020) for Urine MA - NV, Bring Meter and Logs- A1c in Office.  Marquis Lunch, MD [facility] [address] [place], [place] [address] Phone: [phone]  Fax: [phone]    01/20/2020, 5:19 PM  This note was partially dictated with voice recognition software. Similar sounding words can be transcribed inadequately or may not  be corrected upon review.

## 2020-01-26 ENCOUNTER — Telehealth: Payer: Self-pay

## 2020-01-26 NOTE — Telephone Encounter (Signed)
Readings: she said her sugar is high and it is making her sick on her stomach. She started on glipizide on Friday.  Friday - 211 breakfast, after breakfast 162, lunch 93, after lunch 213, at dinner 183 Saturday - 177 breakfast, 249 after breakfast, lunch 359, after lunch 237, after dinner 220 Sunday -  195 breakfast, 264 after breakfast, lunch 323, after lunch 227, dinner 210, after 293, bedtime 300. This morning it was 244.

## 2020-01-26 NOTE — Telephone Encounter (Signed)
Ok, lets have her stop the Glipizide then (for now), and increase her Lantus to 30 units daily at bedtime.

## 2020-01-26 NOTE — Telephone Encounter (Signed)
I saw where Dr. Fransico Him did change some of her medications around due to her having some hypoglycemia noted on her CGM.  She was told do hold off on the bolus insulin, decrease her basal insulin and start low dose Glipizide.  None of these changes should have caused her GI symptoms.  If she has a history of problems with her pancreas, she could be dealing with acute pancreatitis and needs to be seen in the Emergency Department for additional testing and treatment.  I recommend she gets evaluated sooner rather than later to avoid serious complications.

## 2020-01-26 NOTE — Telephone Encounter (Signed)
Advise her to increase her Lantus to 25 units SQ nightly and continue with the low dose Glipizide 2.5 mg XL for now.  It is unlikely that the Glipizide is making her sick on her stomach.

## 2020-01-26 NOTE — Telephone Encounter (Signed)
Advised patient of recommendation to go to ED, verbalized understanding.

## 2020-01-26 NOTE — Telephone Encounter (Signed)
Called patient to advise of instructions. Patient stated that she can't keep anything down, she has pancreas problems and feels she is getting dehydrated, advised patient to go to seek medical attention at ED, patient wants to talk with you, she feels this started when her meds changed, wants to talk with you.

## 2020-01-26 NOTE — Telephone Encounter (Signed)
I called patient back and she stated that she increased the Lantus herself on Saturday to 25units, she is taking Glipizide 2.5mg  daily, stated that she is still running high and her stomach is upset and having pain in abdomen despite the change. Please advise

## 2020-05-20 ENCOUNTER — Ambulatory Visit (INDEPENDENT_AMBULATORY_CARE_PROVIDER_SITE_OTHER): Payer: Medicare Other | Admitting: "Endocrinology

## 2020-05-20 ENCOUNTER — Other Ambulatory Visit: Payer: Self-pay

## 2020-05-20 ENCOUNTER — Encounter: Payer: Self-pay | Admitting: "Endocrinology

## 2020-05-20 VITALS — BP 132/72 | HR 68 | Ht 63.0 in | Wt 173.0 lb

## 2020-05-20 DIAGNOSIS — E0520 Thyrotoxicosis with toxic multinodular goiter without thyrotoxic crisis or storm: Secondary | ICD-10-CM

## 2020-05-20 DIAGNOSIS — E1165 Type 2 diabetes mellitus with hyperglycemia: Secondary | ICD-10-CM

## 2020-05-20 DIAGNOSIS — I10 Essential (primary) hypertension: Secondary | ICD-10-CM

## 2020-05-20 DIAGNOSIS — E032 Hypothyroidism due to medicaments and other exogenous substances: Secondary | ICD-10-CM

## 2020-05-20 DIAGNOSIS — E782 Mixed hyperlipidemia: Secondary | ICD-10-CM | POA: Diagnosis not present

## 2020-05-20 LAB — MICROALBUMIN, URINE: Microalb, Ur: 150

## 2020-05-20 LAB — HEMOGLOBIN A1C: Hemoglobin A1C: 6.4

## 2020-05-20 MED ORDER — CHOLECALCIFEROL 50 MCG (2000 UT) PO CAPS: 1.0000 | ORAL_CAPSULE | Freq: Every day | ORAL | 3 refills | Status: DC

## 2020-05-20 MED ORDER — GLIPIZIDE ER 2.5 MG PO TB24
2.5000 mg | ORAL_TABLET | Freq: Every day | ORAL | 1 refills | Status: DC
Start: 2020-05-20 — End: 2020-06-28

## 2020-05-20 NOTE — Patient Instructions (Signed)

## 2020-05-20 NOTE — Progress Notes (Signed)
05/20/2020, 1:58 PM   Endocrinology follow-up note  Subjective:    Patient ID: Gloria Webb, female    DOB: 1950/10/02.  Gloria Webb is being seen in follow-up after she was seen in consultation for management of type 2 diabetes, hyperlipidemia, hypertension.    PCP: Grayland Jack, PA-C.   Past Medical History:  Diagnosis Date  . Diabetes mellitus, type II (HCC)   . Hypertension   . Thyroid disease     Past Surgical History:  Procedure Laterality Date  . broken ankle      Social History   Socioeconomic History  . Marital status: Divorced    Spouse name: Not on file  . Number of children: Not on file  . Years of education: Not on file  . Highest education level: Not on file  Occupational History  . Not on file  Tobacco Use  . Smoking status: Never Smoker  . Smokeless tobacco: Not on file  Vaping Use  . Vaping Use: Never used  Substance and Sexual Activity  . Alcohol use: Never  . Drug use: Never  . Sexual activity: Not on file  Other Topics Concern  . Not on file  Social History Narrative  . Not on file   Social Determinants of Health   Financial Resource Strain: Not on file  Food Insecurity: Not on file  Transportation Needs: Not on file  Physical Activity: Not on file  Stress: Not on file  Social Connections: Not on file    Family History  Problem Relation Age of Onset  . Hypertension Mother     Outpatient Encounter Medications as of 05/20/2020  Medication Sig  . Cholecalciferol 50 MCG (2000 UT) CAPS Take 1 capsule (2,000 Units total) by mouth daily with breakfast.  . [DISCONTINUED] insulin aspart (NOVOLOG FLEXPEN) 100 UNIT/ML FlexPen Inject 5 Units into the skin 3 (three) times daily with meals.  Marland Kitchen amLODipine (NORVASC) 5 MG tablet Take 5 mg by mouth daily.  Marland Kitchen atorvastatin (LIPITOR) 20 MG tablet Take 20 mg by mouth daily.  . carvedilol (COREG) 25 MG tablet Take 25 mg  by mouth 2 (two) times daily.  Marland Kitchen glipiZIDE (GLUCOTROL XL) 2.5 MG 24 hr tablet Take 1 tablet (2.5 mg total) by mouth daily with breakfast.  . hydrALAZINE (APRESOLINE) 50 MG tablet Take 50 mg by mouth 2 (two) times daily.  Marland Kitchen LEVEMIR FLEXTOUCH 100 UNIT/ML FlexPen Inject 20 Units into the skin at bedtime.  Marland Kitchen losartan (COZAAR) 25 MG tablet Take 25 mg by mouth daily.  . [DISCONTINUED] glipiZIDE (GLUCOTROL XL) 2.5 MG 24 hr tablet Take 1 tablet (2.5 mg total) by mouth daily with breakfast. (Patient not taking: Reported on 05/20/2020)  . [DISCONTINUED] Vitamin D, Ergocalciferol, (DRISDOL) 1.25 MG (50000 UNIT) CAPS capsule Take 50,000 Units by mouth every 14 (fourteen) days.   No facility-administered encounter medications on file as of 05/20/2020.    ALLERGIES: Allergies  Allergen Reactions  . Methimazole Swelling    Pt states she experienced swelling of her face and tongue    VACCINATION STATUS:  There is no immunization history on file for this patient.  Diabetes She presents for her follow-up diabetic visit. She has type 2 diabetes  mellitus. Onset time: She was diagnosed at approximate age of 70 years. Her disease course has been improving. There are no hypoglycemic associated symptoms. Pertinent negatives for hypoglycemia include no confusion, headaches, pallor or seizures. Associated symptoms include fatigue. Pertinent negatives for diabetes include no chest pain, no polydipsia, no polyphagia and no polyuria. There are no hypoglycemic complications. Symptoms are improving. Diabetic complications include nephropathy. Risk factors for coronary artery disease include diabetes mellitus, dyslipidemia, family history, obesity, hypertension, sedentary lifestyle and post-menopausal. Current diabetic treatment includes insulin injections. Her weight is increasing steadily. She is following a generally unhealthy diet. When asked about meal planning, she reported none. She has not had a previous visit with a  dietitian. She never participates in exercise. Her home blood glucose trend is decreasing steadily. (She presents with her CGM device showing 83% time range, 14% slightly above range.  No major hypoglycemia.  Her point-of-care A1c 6.4%, overall improving from 7.6%.     ) An ACE inhibitor/angiotensin II receptor blocker is being taken. Eye exam is current.  Hyperlipidemia This is a chronic problem. The current episode started more than 1 year ago. The problem is controlled. Exacerbating diseases include chronic renal disease, diabetes and obesity. Pertinent negatives include no chest pain, myalgias or shortness of breath. Current antihyperlipidemic treatment includes statins. Risk factors for coronary artery disease include diabetes mellitus, hypertension, obesity, a sedentary lifestyle, post-menopausal and family history.  Hypertension This is a chronic problem. The current episode started more than 1 year ago. Pertinent negatives include no chest pain, headaches, palpitations or shortness of breath. Risk factors for coronary artery disease include dyslipidemia, diabetes mellitus, obesity, sedentary lifestyle, family history and post-menopausal state. Past treatments include angiotensin blockers, beta blockers, calcium channel blockers and direct vasodilators. Identifiable causes of hypertension include chronic renal disease.     Review of Systems  Constitutional: Positive for fatigue. Negative for chills, fever and unexpected weight change.  HENT: Negative for trouble swallowing and voice change.   Eyes: Negative for visual disturbance.  Respiratory: Negative for cough, shortness of breath and wheezing.   Cardiovascular: Negative for chest pain, palpitations and leg swelling.  Gastrointestinal: Negative for diarrhea, nausea and vomiting.  Endocrine: Negative for cold intolerance, heat intolerance, polydipsia, polyphagia and polyuria.  Musculoskeletal: Negative for arthralgias and myalgias.  Skin:  Negative for color change, pallor, rash and wound.  Neurological: Negative for seizures and headaches.  Psychiatric/Behavioral: Negative for confusion and suicidal ideas.    Objective:    Vitals with BMI 70/14/2022 01/20/2020 10/20/2019  Height 5\' 3"  5\' 3"  5\' 3"   Weight 173 lbs 162 lbs 8 oz 176 lbs 6 oz  BMI 30.65 28.79 31.26  Systolic 132 142  Diastolic 72 86 76  Pulse 68 65 76    BP 132/72   Pulse 68   Ht 5\' 3"  (1.6 m)   Wt 173 lb (78.5 kg)   BMI 30.65 kg/m   Wt Readings from Last 3 Encounters:  05/20/20 173 lb (78.5 kg)  01/20/20 162 lb 8 oz (73.7 kg)  10/20/19 176 lb 6.4 oz (80 kg)     Physical Exam Constitutional:      Appearance: She is well-developed.  HENT:     Head: Normocephalic and atraumatic.  Neck:     Thyroid: No thyromegaly.     Trachea: No tracheal deviation.  Cardiovascular:     Rate and Rhythm: Normal rate.  Pulmonary:     Effort: Pulmonary effort is normal.  Abdominal:  Tenderness: There is no abdominal tenderness. There is no guarding.  Musculoskeletal:        General: Normal range of motion.     Cervical back: Normal range of motion and neck supple.  Skin:    General: Skin is warm and dry.     Coloration: Skin is not pale.     Findings: No erythema or rash.  Neurological:     Mental Status: She is alert and oriented to person, place, and time.     Cranial Nerves: No cranial nerve deficit.     Coordination: Coordination normal.     Deep Tendon Reflexes: Reflexes are normal and symmetric.  Psychiatric:        Judgment: Judgment normal.     CMP ( most recent) CMP     Component Value Date/Time   NA 141 12/30/2019 1415   K 4.1 12/30/2019 1415   CL 108 12/30/2019 1415   CO2 25 12/30/2019 1415   GLUCOSE 91 12/30/2019 1415   BUN 34 (H) 12/30/2019 1415   BUN 35 (A) 06/27/2019 0000   CREATININE 1.80 (H) 12/30/2019 1415   CALCIUM 9.1 12/30/2019 1415   PROT 6.9 12/30/2019 1415   ALBUMIN 3.2 (L) 12/30/2019 1415   AST 22 12/30/2019  1415   ALT 21 12/30/2019 1415   ALKPHOS 90 12/30/2019 1415   BILITOT 0.6 12/30/2019 1415   GFRNONAA 30 (L) 12/30/2019 1415   GFRAA 32 06/27/2019 0000     Diabetic Labs (most recent): Lab Results  Component Value Date   HGBA1C 6.8 01/20/2020   HGBA1C 7.2 (A) 10/20/2019   HGBA1C 7.6 06/27/2019     Lipid Panel ( most recent) Lipid Panel     Component Value Date/Time   CHOL 155 06/27/2019 0000   TRIG 63 06/27/2019 0000   HDL 57 06/27/2019 0000   LDLCALC 85 06/27/2019 0000      Lab Results  Component Value Date   TSH 0.265 (L) 12/30/2019   TSH 0.062 (L) 10/09/2019   TSH 7.30 (A) 06/27/2019   FREET4 1.01 12/30/2019   FREET4 1.72 10/09/2019      Assessment & Plan:   1. Uncontrolled type 2 diabetes mellitus with hyperglycemia (HCC)  - Monifah Freehling has currently uncontrolled symptomatic type 2 DM since  70 years of age.  She presents with her CGM device showing 83% time range, 14% slightly above range.  No major hypoglycemia.  Her point-of-care A1c 6.4%, overall improving from 7.6%.     Recent labs reviewed. - I had a long discussion with her about the progressive nature of diabetes and the pathology behind its complications. -her diabetes is complicated by CKD, obesity/sedentary life and she remains at a high risk for more acute and chronic complications which include CAD, CVA, CKD, retinopathy, and neuropathy. These are all discussed in detail with her.  - I have counseled her on diet  and weight management  by adopting a carbohydrate restricted/protein rich diet. Patient is encouraged to switch to  unprocessed or minimally processed     complex starch and increased protein intake (animal or plant source), fruits, and vegetables. -  she is advised to stick to a routine mealtimes to eat 3 meals  a day and avoid unnecessary snacks ( to snack only to correct hypoglycemia).   - she acknowledges that there is a room for improvement in her food and drink choices. -  Suggestion is made for her to avoid simple carbohydrates  from her diet including Cakes,  Sweet Desserts, Ice Cream, Soda (diet and regular), Sweet Tea, Candies, Chips, Cookies, Store Bought Juices, Alcohol in Excess of  1-2 drinks a day, Artificial Sweeteners,  Coffee Creamer, and "Sugar-free" Products, Lemonade. This will help patient to have more stable blood glucose profile and potentially avoid unintended weight gain.   - she will be scheduled with Norm Salt, RDN, CDE for diabetes education.  - I have approached her with the following individualized plan to manage  her diabetes and patient agrees:   -In light of her presentation with tightly controlled glycemic profile, unintended weight gain, she will not need NovoLog.  She was taken off of NovoLog during her last visit, however for unclear reasons she got back on her NovoLog.    She is reapproached and advised to stop NovoLog, lower her Levemir to 20 units nightly, continue to monitor blood glucose regularly using her CGM. -To help her control her postprandial hyperglycemia, I discussed and added glipizide 2.5 mg p.o. daily at breakfast.  -She will continue to benefit from her CGM device, advised to use it at all times.    - she is warned not to take insulin without proper monitoring per orders.  - she is encouraged to call clinic for blood glucose levels less than 70 or above 200 mg /dl.   - Specific targets for  A1c;  LDL, HDL,  and Triglycerides were discussed with the patient.  2) Blood Pressure /Hypertension:  -Her blood pressure is controlled to target. she is advised to continue her current medications including losartan 25 mg p.o. daily, carvedilol 25 mg p.o. twice daily, amlodipine 5 mg daily, hydralazine 50 mg p.o. twice daily.    3) Lipids/Hyperlipidemia:   Review of her recent lipid panel showed  controlled  LDL at 85 .  she  is advised to continue atorvastatin 20 mg p.o. daily at bedtime.   Side effects and precautions  discussed with her.  4)  Weight/Diet:  Body mass index is 30.65 kg/m.  -   clearly complicating her diabetes care.   she is  a candidate for weight loss. I discussed with her the fact that loss of 5 - 10% of her  current body weight will have the most impact on her diabetes management.  Exercise, and detailed carbohydrates information provided  -  detailed on discharge instructions.  5) toxic multinodular goiter: -Her medical records were reviewed.  She underwent bilateral fine-needle aspiration of nodules on both lobes of the thyroid with benign findings.  She was treated adequately with what appears to be methimazole until 5 months ago.  Her recent labs are consistent with euthyroid state.  And her 24-hour uptake was 25.3% on the background of enlarged multinodular thyroid lobe with multiple small hard nodules seen within both lobes consistent with hyperfunctional adenomas.   She will not need ablative treatment.  However she will be considered for thyroid ultrasound after her next visit.     6) Chronic Care/Health Maintenance:  -she  is on ACEI/ARB and Statin medications and  is encouraged to initiate and continue to follow up with Ophthalmology, Dentist,  Podiatrist at least yearly or according to recommendations, and advised to   stay away from smoking. I have recommended yearly flu vaccine and pneumonia vaccine at least every 5 years; moderate intensity exercise for up to 150 minutes weekly; and  sleep for at least 7 hours a day.  She did have normal screening point-of-care ABI in December 2022.   This study will  be repeated in December 2026, or sooner if needed. - she is  advised to maintain close follow up with Grayland Jackichardson, Jennifer, PA-C for primary care needs, as well as her other providers for optimal and coordinated care.   I spent 45 minutes in the care of the patient today including review of labs from CMP, Lipids, Thyroid Function, Hematology (current and previous including  abstractions from other facilities); face-to-face time discussing  her blood glucose readings/logs, discussing hypoglycemia and hyperglycemia episodes and symptoms, medications doses, her options of short and long term treatment based on the latest standards of care / guidelines;  discussion about incorporating lifestyle medicine;  and documenting the encounter.    Please refer to Patient Instructions for Blood Glucose Monitoring and Insulin/Medications Dosing Guide"  in media tab for additional information. Please  also refer to " Patient Self Inventory" in the Media  tab for reviewed elements of pertinent patient history.  Vira AgarSharon Wickstrom participated in the discussions, expressed understanding, and voiced agreement with the above plans.  All questions were answered to her satisfaction. she is encouraged to contact clinic should she have any questions or concerns prior to her return visit.    Follow up plan: - Return in about 4 months (around 09/19/2020) for Bring Meter and Logs- A1c in Office.  Marquis LunchGebre Ritu Gagliardo, MD [facility] [address] [place], [place] [address] Phone: [phone]  Fax: [phone]    05/20/2020, 1:58 PM  This note was partially dictated with voice recognition software. Similar sounding words can be transcribed inadequately or may not  be corrected upon review.

## 2020-05-27 ENCOUNTER — Telehealth: Payer: Self-pay | Admitting: "Endocrinology

## 2020-05-27 NOTE — Telephone Encounter (Signed)
I need her readings over the last 2-3 days to see if we have to change medication or change her dose.

## 2020-05-27 NOTE — Telephone Encounter (Signed)
Pt is calling and requesting a call back from Dr Fransico Him in regards to her dm medicine that she doesn't think is working

## 2020-05-27 NOTE — Telephone Encounter (Signed)
Pt's BG readings for Tuesday a.m. 200, before lunch 230, before bedtime 274, Wednesday a.m.230, before lunch 212, before supper 220, before bed 264, today 230 this a.m. and 224 at 4:15p.m. Pt takes levemir 20 units at bedtime and glipizide 2.5mg  daily.

## 2020-05-28 NOTE — Telephone Encounter (Signed)
Discussed with pt, understanding voiced. 

## 2020-05-28 NOTE — Telephone Encounter (Signed)
Increase Levemir to 30 units qhs, double glipizide to make 5mg  once at breakfast ( call back if BG still > 200 x 3).

## 2020-06-24 ENCOUNTER — Telehealth: Payer: Self-pay

## 2020-06-24 NOTE — Telephone Encounter (Signed)
She has to start monitoring glucose 4 times daily, before meals and at bed time. She can increase  her levemir to 30 units qhs, restart Novolog 5 units with 3 meals if glucose reads above 90. Keep her appointment.

## 2020-06-24 NOTE — Telephone Encounter (Signed)
Pt notified and agrees. 

## 2020-06-24 NOTE — Telephone Encounter (Signed)
Patient said she was at [facility] yesterday for high sugar. They did not send her home with any instructions she states. She said this morning her sugar was 356 but now it keeps reading high, she said she feels like she wants to vomit. I gave her an appointment for Monday but what do you advise for now?

## 2020-06-28 ENCOUNTER — Ambulatory Visit (INDEPENDENT_AMBULATORY_CARE_PROVIDER_SITE_OTHER): Payer: Medicare Other | Admitting: "Endocrinology

## 2020-06-28 ENCOUNTER — Other Ambulatory Visit: Payer: Self-pay

## 2020-06-28 ENCOUNTER — Encounter: Payer: Self-pay | Admitting: "Endocrinology

## 2020-06-28 VITALS — BP 106/68 | HR 72 | Ht 63.0 in | Wt 166.8 lb

## 2020-06-28 DIAGNOSIS — I10 Essential (primary) hypertension: Secondary | ICD-10-CM | POA: Diagnosis not present

## 2020-06-28 DIAGNOSIS — E782 Mixed hyperlipidemia: Secondary | ICD-10-CM

## 2020-06-28 DIAGNOSIS — E876 Hypokalemia: Secondary | ICD-10-CM

## 2020-06-28 DIAGNOSIS — E1165 Type 2 diabetes mellitus with hyperglycemia: Secondary | ICD-10-CM | POA: Diagnosis not present

## 2020-06-28 MED ORDER — POTASSIUM CHLORIDE CRYS ER 20 MEQ PO TBCR: 20.0000 meq | EXTENDED_RELEASE_TABLET | Freq: Every day | ORAL | 0 refills | Status: DC

## 2020-06-28 NOTE — Patient Instructions (Signed)

## 2020-06-28 NOTE — Progress Notes (Signed)
06/28/2020, 2:12 PM   Endocrinology follow-up note  Subjective:    Patient ID: Gloria Webb, female    DOB: [birth year].  Gloria Webb is being seen in follow-up after she was seen in consultation for management of type 2 diabetes, hyperlipidemia, hypertension.    PCP: Grayland Jack, PA-C.   Past Medical History:  Diagnosis Date  . Diabetes mellitus, type II (HCC)   . Hypertension   . Thyroid disease     Past Surgical History:  Procedure Laterality Date  . broken ankle      Social History   Socioeconomic History  . Marital status: Divorced    Spouse name: Not on file  . Number of children: Not on file  . Years of education: Not on file  . Highest education level: Not on file  Occupational History  . Not on file  Tobacco Use  . Smoking status: Never Smoker  . Smokeless tobacco: Not on file  Vaping Use  . Vaping Use: Never used  Substance and Sexual Activity  . Alcohol use: Never  . Drug use: Never  . Sexual activity: Not on file  Other Topics Concern  . Not on file  Social History Narrative  . Not on file   Social Determinants of Health   Financial Resource Strain: Not on file  Food Insecurity: Not on file  Transportation Needs: Not on file  Physical Activity: Not on file  Stress: Not on file  Social Connections: Not on file    Family History  Problem Relation Age of Onset  . Hypertension Mother     Outpatient Encounter Medications as of 06/28/2020  Medication Sig  . potassium chloride SA (KLOR-CON) 20 MEQ tablet Take 1 tablet (20 mEq total) by mouth daily.  Marland Kitchen amLODipine (NORVASC) 5 MG tablet Take 5 mg by mouth daily.  Marland Kitchen atorvastatin (LIPITOR) 20 MG tablet Take 20 mg by mouth daily.  . carvedilol (COREG) 25 MG tablet Take 25 mg by mouth 2 (two) times daily.  . Cholecalciferol 50 MCG (2000 UT) CAPS Take 1 capsule (2,000 Units total) by mouth daily with breakfast.  .  hydrALAZINE (APRESOLINE) 50 MG tablet Take 50 mg by mouth 2 (two) times daily.  . insulin aspart (NOVOLOG) 100 unit/mL injection Inject 6-12 Units into the skin 3 (three) times daily before meals.  Marland Kitchen LEVEMIR FLEXTOUCH 100 UNIT/ML FlexPen Inject 34 Units into the skin at bedtime.  Marland Kitchen losartan (COZAAR) 25 MG tablet Take 25 mg by mouth daily.  . [DISCONTINUED] glipiZIDE (GLUCOTROL XL) 2.5 MG 24 hr tablet Take 1 tablet (2.5 mg total) by mouth daily with breakfast. (Patient taking differently: Take 5 mg by mouth daily with breakfast.)   No facility-administered encounter medications on file as of 06/28/2020.    ALLERGIES: Allergies  Allergen Reactions  . Methimazole Swelling    Pt states she experienced swelling of her face and tongue    VACCINATION STATUS:  There is no immunization history on file for this patient.  Diabetes She presents for her follow-up diabetic visit. She has type 2 diabetes mellitus. Onset time: She was diagnosed at approximate age of 70 years. Her disease course has been worsening. There are no hypoglycemic  associated symptoms. Pertinent negatives for hypoglycemia include no confusion, headaches, pallor or seizures. Associated symptoms include polydipsia and polyuria. Pertinent negatives for diabetes include no chest pain, no fatigue and no polyphagia. There are no hypoglycemic complications. Symptoms are worsening. Diabetic complications include nephropathy. Risk factors for coronary artery disease include diabetes mellitus, dyslipidemia, family history, obesity, hypertension, sedentary lifestyle and post-menopausal. Current diabetic treatment includes insulin injections. Her weight is decreasing steadily. She is following a generally unhealthy diet. When asked about meal planning, she reported none. She has not had a previous visit with a dietitian. She never participates in exercise. Her home blood glucose trend is increasing steadily. Her breakfast blood glucose range is  generally >200 mg/dl. Her lunch blood glucose range is generally >200 mg/dl. Her dinner blood glucose range is generally >200 mg/dl. Her bedtime blood glucose range is generally >200 mg/dl. Her overall blood glucose range is >200 mg/dl. (On Jun 23, 2020, patient called for hypoglycemia.  She was restarted on basal/bolus insulin with subsequent improvement in her glycemic profile.  She presents with her CGM device showing 246 average blood glucose for the last 14 days.  Her AGP shows 25% time range, 75% above range.  No hypoglycemia.  This is despite her recent A1c of 6.4%.   She denies any exposure to steroids.  She visited ER for chest pain at [facility] with no major cardiac diagnosis.   ) An ACE inhibitor/angiotensin II receptor blocker is being taken. Eye exam is current.  Hyperlipidemia This is a chronic problem. The current episode started more than 1 year ago. The problem is controlled. Exacerbating diseases include chronic renal disease, diabetes and obesity. Pertinent negatives include no chest pain, myalgias or shortness of breath. Current antihyperlipidemic treatment includes statins. Risk factors for coronary artery disease include diabetes mellitus, hypertension, obesity, a sedentary lifestyle, post-menopausal and family history.  Hypertension This is a chronic problem. The current episode started more than 1 year ago. Pertinent negatives include no chest pain, headaches, palpitations or shortness of breath. Risk factors for coronary artery disease include dyslipidemia, diabetes mellitus, obesity, sedentary lifestyle, family history and post-menopausal state. Past treatments include angiotensin blockers, beta blockers, calcium channel blockers and direct vasodilators. Identifiable causes of hypertension include chronic renal disease.     Review of Systems  Constitutional: Negative for chills, fatigue, fever and unexpected weight change.  HENT: Negative for trouble swallowing and voice  change.   Eyes: Negative for visual disturbance.  Respiratory: Negative for cough, shortness of breath and wheezing.   Cardiovascular: Negative for chest pain, palpitations and leg swelling.  Gastrointestinal: Negative for diarrhea, nausea and vomiting.  Endocrine: Positive for polydipsia and polyuria. Negative for cold intolerance, heat intolerance and polyphagia.  Musculoskeletal: Negative for arthralgias and myalgias.  Skin: Negative for color change, pallor, rash and wound.  Neurological: Negative for seizures and headaches.  Psychiatric/Behavioral: Negative for confusion and suicidal ideas.    Objective:    Vitals with BMI 06/28/2020 05/20/2020 01/20/2020  Height 5\' 3"  5\' 3"  5\' 3"   Weight 166 lbs 13 oz 173 lbs 162 lbs 8 oz  BMI 29.55 30.65 28.79  Systolic 106 132  Diastolic 68 72 86  Pulse 72 68 65    BP 106/68   Pulse 72   Ht 5\' 3"  (1.6 m)   Wt 166 lb 12.8 oz (75.7 kg)   BMI 29.55 kg/m   Wt Readings from Last 3 Encounters:  06/28/20 166 lb 12.8 oz (75.7 kg)  05/20/20 173 lb (  78.5 kg)  01/20/20 162 lb 8 oz (73.7 kg)     Physical Exam Constitutional:      Appearance: She is well-developed.  HENT:     Head: Normocephalic and atraumatic.  Neck:     Thyroid: No thyromegaly.     Trachea: No tracheal deviation.  Cardiovascular:     Rate and Rhythm: Normal rate.  Pulmonary:     Effort: Pulmonary effort is normal.  Abdominal:     Tenderness: There is no abdominal tenderness. There is no guarding.  Musculoskeletal:        General: Normal range of motion.     Cervical back: Normal range of motion and neck supple.  Skin:    General: Skin is warm and dry.     Coloration: Skin is not pale.     Findings: No erythema or rash.  Neurological:     Mental Status: She is alert and oriented to person, place, and time.     Cranial Nerves: No cranial nerve deficit.     Coordination: Coordination normal.     Deep Tendon Reflexes: Reflexes are normal and symmetric.   Psychiatric:        Judgment: Judgment normal.     CMP ( most recent) CMP     Component Value Date/Time   NA 141 12/30/2019 1415   K 4.1 12/30/2019 1415   CL 108 12/30/2019 1415   CO2 25 12/30/2019 1415   GLUCOSE 91 12/30/2019 1415   BUN 34 (H) 12/30/2019 1415   BUN 35 (A) 06/27/2019 0000   CREATININE 1.80 (H) 12/30/2019 1415   CALCIUM 9.1 12/30/2019 1415   PROT 6.9 12/30/2019 1415   ALBUMIN 3.2 (L) 12/30/2019 1415   AST 22 12/30/2019 1415   ALT 21 12/30/2019 1415   ALKPHOS 90 12/30/2019 1415   BILITOT 0.6 12/30/2019 1415   GFRNONAA 30 (L) 12/30/2019 1415   GFRAA 32 06/27/2019 0000     Diabetic Labs (most recent): Lab Results  Component Value Date   HGBA1C 6.4 05/20/2020   HGBA1C 6.8 01/20/2020   HGBA1C 7.2 (A) 10/20/2019     Lipid Panel ( most recent) Lipid Panel     Component Value Date/Time   CHOL 155 06/27/2019 0000   TRIG 63 06/27/2019 0000   HDL 57 06/27/2019 0000   LDLCALC 85 06/27/2019 0000      Lab Results  Component Value Date   TSH 0.265 (L) 12/30/2019   TSH 0.062 (L) 10/09/2019   TSH 7.30 (A) 06/27/2019   FREET4 1.01 12/30/2019   FREET4 1.72 10/09/2019      Assessment & Plan:   1. Uncontrolled type 2 diabetes mellitus with hyperglycemia (HCC)  - Ayahna Solazzo has currently uncontrolled symptomatic type 2 DM since  70 years of age.  On Jun 23, 2020, patient called for hypoglycemia.  She was restarted on basal/bolus insulin with subsequent improvement in her glycemic profile.  She presents with her CGM device showing 246 average blood glucose for the last 14 days.  Her AGP shows 25% time range, 75% above range.  No hypoglycemia.  This is despite her recent A1c of 6.4%.   She denies any exposure to steroids.  She visited ER for chest pain at [facility] with no major cardiac diagnosis.   Recent labs reviewed. - I had a long discussion with her about the progressive nature of diabetes and the pathology behind its complications. -her  diabetes is complicated by CKD, obesity/sedentary life and she remains at  a high risk for more acute and chronic complications which include CAD, CVA, CKD, retinopathy, and neuropathy. These are all discussed in detail with her.  - I have counseled her on diet  and weight management  by adopting a carbohydrate restricted/protein rich diet. Patient is encouraged to switch to  unprocessed or minimally processed     complex starch and increased protein intake (animal or plant source), fruits, and vegetables. -  she is advised to stick to a routine mealtimes to eat 3 meals  a day and avoid unnecessary snacks ( to snack only to correct hypoglycemia).   - she acknowledges that there is a room for improvement in her food and drink choices. - Suggestion is made for her to avoid simple carbohydrates  from her diet including Cakes, Sweet Desserts, Ice Cream, Soda (diet and regular), Sweet Tea, Candies, Chips, Cookies, Store Bought Juices, Alcohol in Excess of  1-2 drinks a day, Artificial Sweeteners,  Coffee Creamer, and "Sugar-free" Products, Lemonade. This will help patient to have more stable blood glucose profile and potentially avoid unintended weight gain.   - she will be scheduled with Norm SaltPenny Crumpton, RDN, CDE for diabetes education.  - I have approached her with the following individualized plan to manage  her diabetes and patient agrees:   -In light of her presentation with loss of control of diabetes with significant hyperglycemia, she will be continued on basal/bolus insulin in order for her to achieve control of diabetes to target.   -She is benefiting from her CGM.  She is advised to use it at all times.  She is advised to increase her Levemir to 34 units nightly, increase her NovoLog to 6 units 3 times daily AC, for Premeal blood glucose readings between 90-150 mg per DL.  She is allowed to add correction dose NovoLog for blood glucose readings above 150 mg per DL.  At this point, she is advised  to discontinue her glipizide. - she is warned not to take insulin without proper monitoring per orders.  - she is encouraged to call clinic for blood glucose levels less than 70 or above 200 mg /dl.   - Specific targets for  A1c;  LDL, HDL,  and Triglycerides were discussed with the patient.  2) Blood Pressure /Hypertension:  -Her blood pressure is controlled to target.  - she is advised to continue her current medications including losartan 25 mg p.o. daily, carvedilol 25 mg p.o. twice daily, amlodipine 5 mg daily, hydralazine 50 mg p.o. twice daily.  -She was found to have hypokalemia of 2.8.  She is not on potassium supplement at I discussed and added potassium 20 mEq daily for the next 90 days.  3) Lipids/Hyperlipidemia:   Review of her recent lipid panel showed  controlled  LDL at 85 .  she  is advised to continue atorvastatin 20 mg p.o. daily at bedtime.   -  Side effects and precautions discussed with her.  4)  Weight/Diet:  Body mass index is 29.55 kg/m.  -   clearly complicating her diabetes care.   she is  a candidate for weight loss. I discussed with her the fact that loss of 5 - 10% of her  current body weight will have the most impact on her diabetes management.  Exercise, and detailed carbohydrates information provided  -  detailed on discharge instructions.  5) toxic multinodular goiter: -Her medical records were reviewed.  She underwent bilateral fine-needle aspiration of nodules on both lobes of the  thyroid with benign findings.  She was treated adequately with what appears to be methimazole until 5 months ago.  Her recent labs are consistent with euthyroid state.  And her 24-hour uptake was 25.3% on the background of enlarged multinodular thyroid lobe with multiple small hard nodules seen within both lobes consistent with hyperfunctional adenomas.   She will not need ablative treatment.  However she will be considered for thyroid ultrasound after her next visit.     6)  Chronic Care/Health Maintenance:  -she  is on ACEI/ARB and Statin medications and  is encouraged to initiate and continue to follow up with Ophthalmology, Dentist,  Podiatrist at least yearly or according to recommendations, and advised to   stay away from smoking. I have recommended yearly flu vaccine and pneumonia vaccine at least every 5 years; moderate intensity exercise for up to 150 minutes weekly; and  sleep for at least 7 hours a day.  She did have normal screening point-of-care ABI in December 2022.   This study will be repeated in December 2026, or sooner if needed. - she is  advised to maintain close follow up with Grayland Jack, PA-C for primary care needs, as well as her other providers for optimal and coordinated care.     I spent 35 minutes in the care of the patient today including review of labs from CMP, Lipids, Thyroid Function, Hematology (current and previous including abstractions from other facilities); face-to-face time discussing  her blood glucose readings/logs, discussing hypoglycemia and hyperglycemia episodes and symptoms, medications doses, her options of short and long term treatment based on the latest standards of care / guidelines;  discussion about incorporating lifestyle medicine;  and documenting the encounter.    Please refer to Patient Instructions for Blood Glucose Monitoring and Insulin/Medications Dosing Guide"  in media tab for additional information. Please  also refer to " Patient Self Inventory" in the Media  tab for reviewed elements of pertinent patient history.  Vira Agar participated in the discussions, expressed understanding, and voiced agreement with the above plans.  All questions were answered to her satisfaction. she is encouraged to contact clinic should she have any questions or concerns prior to her return visit.   Follow up plan: - Return in about 3 months (around 09/28/2020) for F/U with Pre-visit Labs, Meter, Logs, A1c  here.Marquis Lunch, MD [facility] [address] [place], [place] [address] Phone: [phone]  Fax: [phone]    06/28/2020, 2:12 PM  This note was partially dictated with voice recognition software. Similar sounding words can be transcribed inadequately or may not  be corrected upon review.

## 2020-09-17 ENCOUNTER — Other Ambulatory Visit (HOSPITAL_COMMUNITY)
Admission: RE | Admit: 2020-09-17 | Discharge: 2020-09-17 | Disposition: A | Discharge status: Home / Self Care | Payer: Medicare Other | Source: Ambulatory Visit | Attending: "Endocrinology | Admitting: "Endocrinology

## 2020-09-17 DIAGNOSIS — E1165 Type 2 diabetes mellitus with hyperglycemia: Secondary | ICD-10-CM | POA: Diagnosis present

## 2020-09-17 LAB — COMPREHENSIVE METABOLIC PANEL
ALT: 13 U/L (ref 0–44)
AST: 19 U/L (ref 15–41)
Albumin: 3 g/dL — ABNORMAL LOW (ref 3.5–5.0)
Alkaline Phosphatase: 80 U/L (ref 38–126)
Anion gap: 4 — ABNORMAL LOW (ref 5–15)
BUN: 38 mg/dL — ABNORMAL HIGH (ref 8–23)
CO2: 29 mmol/L (ref 22–32)
Calcium: 8.6 mg/dL — ABNORMAL LOW (ref 8.9–10.3)
Chloride: 106 mmol/L (ref 98–111)
Creatinine, Ser: 2.04 mg/dL — ABNORMAL HIGH (ref 0.44–1.00)
GFR, Estimated: 26 mL/min — ABNORMAL LOW (ref >60)
Glucose, Bld: 150 mg/dL — ABNORMAL HIGH (ref 70–99)
Potassium: 4 mmol/L (ref 3.5–5.1)
Sodium: 139 mmol/L (ref 135–145)
Total Bilirubin: 1.4 mg/dL — ABNORMAL HIGH (ref 0.3–1.2)
Total Protein: 6.3 g/dL — ABNORMAL LOW (ref 6.5–8.1)

## 2020-09-21 ENCOUNTER — Ambulatory Visit: Payer: Medicare Other | Admitting: "Endocrinology

## 2020-09-30 ENCOUNTER — Ambulatory Visit (INDEPENDENT_AMBULATORY_CARE_PROVIDER_SITE_OTHER): Payer: Medicare Other | Admitting: "Endocrinology

## 2020-09-30 ENCOUNTER — Other Ambulatory Visit: Payer: Self-pay

## 2020-09-30 ENCOUNTER — Encounter: Payer: Self-pay | Admitting: "Endocrinology

## 2020-09-30 VITALS — BP 140/78 | HR 68 | Ht 63.0 in | Wt 160.8 lb

## 2020-09-30 DIAGNOSIS — E782 Mixed hyperlipidemia: Secondary | ICD-10-CM | POA: Diagnosis not present

## 2020-09-30 DIAGNOSIS — I10 Essential (primary) hypertension: Secondary | ICD-10-CM | POA: Diagnosis not present

## 2020-09-30 DIAGNOSIS — E1165 Type 2 diabetes mellitus with hyperglycemia: Secondary | ICD-10-CM

## 2020-09-30 DIAGNOSIS — E0520 Thyrotoxicosis with toxic multinodular goiter without thyrotoxic crisis or storm: Secondary | ICD-10-CM | POA: Diagnosis not present

## 2020-09-30 LAB — POCT GLYCOSYLATED HEMOGLOBIN (HGB A1C): HbA1c, POC (controlled diabetic range): 6.3 % (ref 0.0–7.0)

## 2020-09-30 NOTE — Patient Instructions (Signed)

## 2020-09-30 NOTE — Progress Notes (Signed)
09/30/2020, 2:48 PM   Endocrinology follow-up note  Subjective:    Patient ID: Gloria Webb, female    DOB: 1950-12-16.  Gloria Webb is being seen in follow-up after she was seen in consultation for management of type 2 diabetes, hyperlipidemia, hypertension.    PCP: Grayland Jack, PA-C.   Past Medical History:  Diagnosis Date   Diabetes mellitus, type II (HCC)    Hypertension    Thyroid disease     Past Surgical History:  Procedure Laterality Date   broken ankle      Social History   Socioeconomic History   Marital status: Divorced    Spouse name: Not on file   Number of children: Not on file   Years of education: Not on file   Highest education level: Not on file  Occupational History   Not on file  Tobacco Use   Smoking status: Never   Smokeless tobacco: Not on file  Vaping Use   Vaping Use: Never used  Substance and Sexual Activity   Alcohol use: Never   Drug use: Never   Sexual activity: Not on file  Other Topics Concern   Not on file  Social History Narrative   Not on file   Social Determinants of Health   Financial Resource Strain: Not on file  Food Insecurity: Not on file  Transportation Needs: Not on file  Physical Activity: Not on file  Stress: Not on file  Social Connections: Not on file    Family History  Problem Relation Age of Onset   Hypertension Mother     Outpatient Encounter Medications as of 09/30/2020  Medication Sig   atorvastatin (LIPITOR) 20 MG tablet Take 20 mg by mouth daily.   carvedilol (COREG) 25 MG tablet Take 25 mg by mouth 2 (two) times daily.   Cholecalciferol 50 MCG (2000 UT) CAPS Take 1 capsule (2,000 Units total) by mouth daily with breakfast.   hydrALAZINE (APRESOLINE) 50 MG tablet Take 50 mg by mouth 2 (two) times daily.   insulin aspart (NOVOLOG) 100 unit/mL injection Inject 4-7 Units into the skin 3 (three) times daily before  meals.   LEVEMIR FLEXTOUCH 100 UNIT/ML FlexPen Inject 30 Units into the skin at bedtime.   losartan (COZAAR) 25 MG tablet Take 50 mg by mouth daily.   [DISCONTINUED] amLODipine (NORVASC) 5 MG tablet Take 5 mg by mouth daily. (Patient not taking: Reported on 09/30/2020)   [DISCONTINUED] potassium chloride SA (KLOR-CON) 20 MEQ tablet Take 1 tablet (20 mEq total) by mouth daily. (Patient not taking: Reported on 09/30/2020)   No facility-administered encounter medications on file as of 09/30/2020.    ALLERGIES: Allergies  Allergen Reactions   Methimazole Swelling    Pt states she experienced swelling of her face and tongue    VACCINATION STATUS:  There is no immunization history on file for this patient.  Diabetes She presents for her follow-up diabetic visit. She has type 2 diabetes mellitus. Onset time: She was diagnosed at approximate age of 35 years. Her disease course has been improving. There are no hypoglycemic associated symptoms. Pertinent negatives for hypoglycemia include no confusion, headaches, pallor or seizures. Associated symptoms include polydipsia and polyuria. Pertinent  negatives for diabetes include no chest pain, no fatigue and no polyphagia. There are no hypoglycemic complications. Symptoms are improving. Diabetic complications include nephropathy. Risk factors for coronary artery disease include diabetes mellitus, dyslipidemia, family history, obesity, hypertension, sedentary lifestyle and post-menopausal. Current diabetic treatment includes insulin injections. Her weight is decreasing steadily. She is following a generally unhealthy diet. When asked about meal planning, she reported none. She has not had a previous visit with a dietitian. She never participates in exercise. Her home blood glucose trend is decreasing steadily. Her breakfast blood glucose range is generally 130-140 mg/dl. Her lunch blood glucose range is generally 110-130 mg/dl. Her dinner blood glucose range is  generally 130-140 mg/dl. Her bedtime blood glucose range is generally 130-140 mg/dl. Her overall blood glucose range is 130-140 mg/dl. (She presents with her CGM device showing 84% time range, 5% above range, she has less than 10% mild hypoglycemia.  Her point-of-care A1c is 6.3% overall improving.  ) An ACE inhibitor/angiotensin II receptor blocker is being taken. Eye exam is current.  Hyperlipidemia This is a chronic problem. The current episode started more than 1 year ago. The problem is controlled. Exacerbating diseases include chronic renal disease, diabetes and obesity. Pertinent negatives include no chest pain, myalgias or shortness of breath. Current antihyperlipidemic treatment includes statins. Risk factors for coronary artery disease include diabetes mellitus, hypertension, obesity, a sedentary lifestyle, post-menopausal and family history.  Hypertension This is a chronic problem. The current episode started more than 1 year ago. Pertinent negatives include no chest pain, headaches, palpitations or shortness of breath. Risk factors for coronary artery disease include dyslipidemia, diabetes mellitus, obesity, sedentary lifestyle, family history and post-menopausal state. Past treatments include angiotensin blockers, beta blockers, calcium channel blockers and direct vasodilators. Identifiable causes of hypertension include chronic renal disease.    Review of Systems  Constitutional:  Negative for chills, fatigue, fever and unexpected weight change.  HENT:  Negative for trouble swallowing and voice change.   Eyes:  Negative for visual disturbance.  Respiratory:  Negative for cough, shortness of breath and wheezing.   Cardiovascular:  Negative for chest pain, palpitations and leg swelling.  Gastrointestinal:  Negative for diarrhea, nausea and vomiting.  Endocrine: Positive for polydipsia and polyuria. Negative for cold intolerance, heat intolerance and polyphagia.  Musculoskeletal:  Negative  for arthralgias and myalgias.  Skin:  Negative for color change, pallor, rash and wound.  Neurological:  Negative for seizures and headaches.  Psychiatric/Behavioral:  Negative for confusion and suicidal ideas.    Objective:    Vitals with BMI 09/30/2020 06/28/2020 05/20/2020  Height 5\' 3"  5\' 3"  5\' 3"   Weight 160 lbs 13 oz 166 lbs 13 oz 173 lbs  BMI 28.49 29.55 30.65  Systolic 140 106 409132  Diastolic 78 68 72  Pulse 68 72 68    BP 140/78   Pulse 68   Ht 5\' 3"  (1.6 m)   Wt 160 lb 12.8 oz (72.9 kg)   BMI 28.48 kg/m   Wt Readings from Last 3 Encounters:  09/30/20 160 lb 12.8 oz (72.9 kg)  06/28/20 166 lb 12.8 oz (75.7 kg)  05/20/20 173 lb (78.5 kg)     Physical Exam Constitutional:      Appearance: She is well-developed.  HENT:     Head: Normocephalic and atraumatic.  Neck:     Thyroid: No thyromegaly.     Trachea: No tracheal deviation.  Cardiovascular:     Rate and Rhythm: Normal rate.  Pulmonary:  Effort: Pulmonary effort is normal.  Abdominal:     Tenderness: There is no abdominal tenderness. There is no guarding.  Musculoskeletal:        General: Normal range of motion.     Cervical back: Normal range of motion and neck supple.  Skin:    General: Skin is warm and dry.     Coloration: Skin is not pale.     Findings: No erythema or rash.  Neurological:     Mental Status: She is alert and oriented to person, place, and time.     Cranial Nerves: No cranial nerve deficit.     Coordination: Coordination normal.     Deep Tendon Reflexes: Reflexes are normal and symmetric.  Psychiatric:        Judgment: Judgment normal.    CMP ( most recent) CMP     Component Value Date/Time   NA 139 09/17/2020 1313   K 4.0 09/17/2020 1313   CL 106 09/17/2020 1313   CO2 29 09/17/2020 1313   GLUCOSE 150 (H) 09/17/2020 1313   BUN 38 (H) 09/17/2020 1313   BUN 35 (A) 06/27/2019 0000   CREATININE 2.04 (H) 09/17/2020 1313   CALCIUM 8.6 (L) 09/17/2020 1313   PROT 6.3 (L)  09/17/2020 1313   ALBUMIN 3.0 (L) 09/17/2020 1313   AST 19 09/17/2020 1313   ALT 13 09/17/2020 1313   ALKPHOS 80 09/17/2020 1313   BILITOT 1.4 (H) 09/17/2020 1313   GFRNONAA 26 (L) 09/17/2020 1313   GFRAA 32 06/27/2019 0000     Diabetic Labs (most recent): Lab Results  Component Value Date   HGBA1C 6.3 09/30/2020   HGBA1C 6.4 05/20/2020   HGBA1C 6.8 01/20/2020     Lipid Panel ( most recent) Lipid Panel     Component Value Date/Time   CHOL 155 06/27/2019 0000   TRIG 63 06/27/2019 0000   HDL 57 06/27/2019 0000   LDLCALC 85 06/27/2019 0000      Lab Results  Component Value Date   TSH 0.265 (L) 12/30/2019   TSH 0.062 (L) 10/09/2019   TSH 7.30 (A) 06/27/2019   FREET4 1.01 12/30/2019   FREET4 1.72 10/09/2019      Assessment & Plan:   1. Uncontrolled type 2 diabetes mellitus with hyperglycemia (HCC)  - Gloria Webb has currently uncontrolled symptomatic type 2 DM since  70 years of age.  She presents with her CGM device showing 84% time range, 5% above range, she has less than 10% mild hypoglycemia.  Her point-of-care A1c is 6.3% overall improving.     Recent labs reviewed. - I had a long discussion with her about the progressive nature of diabetes and the pathology behind its complications. -her diabetes is complicated by CKD, obesity/sedentary life and she remains at a high risk for more acute and chronic complications which include CAD, CVA, CKD, retinopathy, and neuropathy. These are all discussed in detail with her.  - I have counseled her on diet  and weight management  by adopting a carbohydrate restricted/protein rich diet. Patient is encouraged to switch to  unprocessed or minimally processed     complex starch and increased protein intake (animal or plant source), fruits, and vegetables. -  she is advised to stick to a routine mealtimes to eat 3 meals  a day and avoid unnecessary snacks ( to snack only to correct hypoglycemia).   - she acknowledges that  there is a room for improvement in her food and drink choices. - Suggestion  is made for her to avoid simple carbohydrates  from her diet including Cakes, Sweet Desserts, Ice Cream, Soda (diet and regular), Sweet Tea, Candies, Chips, Cookies, Store Bought Juices, Alcohol in Excess of  1-2 drinks a day, Artificial Sweeteners,  Coffee Creamer, and "Sugar-free" Products, Lemonade. This will help patient to have more stable blood glucose profile and potentially avoid unintended weight gain.   - she will be scheduled with Norm Salt, RDN, CDE for diabetes education.  - I have approached her with the following individualized plan to manage  her diabetes and patient agrees:   -In light of her presentation with tight glycemic control including hypoglycemia, she will be considered for lower dose of insulin.  She is benefiting from her CGM device, advised to wear at all times.  She is advised to lower Levemir to 30 units nightly, lower NovoLog to 4   units 3 times daily AC, for Premeal blood glucose readings between 90-150 mg per DL.  She is allowed to add correction dose NovoLog for blood glucose readings above 150 mg per DL.  At this point, she is advised to discontinue her glipizide. - she is warned not to take insulin without proper monitoring per orders.  - she is encouraged to call clinic for blood glucose levels less than 70 or above 200 mg /dl.   - Specific targets for  A1c;  LDL, HDL,  and Triglycerides were discussed with the patient.  2) Blood Pressure /Hypertension:  -Her blood pressure is controlled to target.  - she is advised to continue her current medications including losartan 25 mg p.o. daily, carvedilol 25 mg p.o. twice daily, hydralazine 50 mg p.o. twice daily.  -She was given potassium supplement which has helped correct her potassium to 4 mmol/L.  She is advised to discontinue potassium.      3) Lipids/Hyperlipidemia:   Review of her recent lipid panel showed  controlled   LDL at 85 .  she  is advised to continue atorvastatin 20 mg p.o. daily at bedtime.   -  Side effects and precautions discussed with her.  4)  Weight/Diet:  Body mass index is 28.48 kg/m.  -   clearly complicating her diabetes care.   she is  a candidate for weight loss. I discussed with her the fact that loss of 5 - 10% of her  current body weight will have the most impact on her diabetes management.  Exercise, and detailed carbohydrates information provided  -  detailed on discharge instructions.  5) toxic multinodular goiter: -Her medical records were reviewed.  She underwent bilateral fine-needle aspiration of nodules on both lobes of the thyroid with benign findings.  She was treated adequately with what appears to be methimazole until 5 months ago.  Her recent labs are consistent with euthyroid state.  And her 24-hour uptake was 25.3% on the background of enlarged multinodular thyroid lobe with multiple small hard nodules seen within both lobes consistent with hyperfunctional adenomas.   She will not need ablative treatment.  However she will be considered for thyroid ultrasound after her next visit.   She will have thyroid function test before her next visit.   6) Chronic Care/Health Maintenance:  -she  is on ACEI/ARB and Statin medications and  is encouraged to initiate and continue to follow up with Ophthalmology, Dentist,  Podiatrist at least yearly or according to recommendations, and advised to   stay away from smoking. I have recommended yearly flu vaccine and pneumonia vaccine at  least every 5 years; moderate intensity exercise for up to 150 minutes weekly; and  sleep for at least 7 hours a day.  She did have normal screening point-of-care ABI in December 2022.   This study will be repeated in December 2026, or sooner if needed. - she is  advised to maintain close follow up with Grayland Jack, PA-C for primary care needs, as well as her other providers for optimal and  coordinated care.    I spent 32 minutes in the care of the patient today including review of labs from CMP, Lipids, Thyroid Function, Hematology (current and previous including abstractions from other facilities); face-to-face time discussing  her blood glucose readings/logs, discussing hypoglycemia and hyperglycemia episodes and symptoms, medications doses, her options of short and long term treatment based on the latest standards of care / guidelines;  discussion about incorporating lifestyle medicine;  and documenting the encounter.    Please refer to Patient Instructions for Blood Glucose Monitoring and Insulin/Medications Dosing Guide"  in media tab for additional information. Please  also refer to " Patient Self Inventory" in the Media  tab for reviewed elements of pertinent patient history.  Gloria Webb participated in the discussions, expressed understanding, and voiced agreement with the above plans.  All questions were answered to her satisfaction. she is encouraged to contact clinic should she have any questions or concerns prior to her return visit.    Follow up plan: - Return in about 4 months (around 01/30/2021) for F/U with Pre-visit Labs, Meter, Logs, A1c here.Marquis Lunch, MD [facility] [address] [place], [place] [address] Phone: [phone]  Fax: [phone]    09/30/2020, 2:48 PM  This note was partially dictated with voice recognition software. Similar sounding words can be transcribed inadequately or may not  be corrected upon review.

## 2020-11-26 LAB — HEPATIC FUNCTION PANEL
ALT: 10 (ref 7–35)
AST: 20 (ref 13–35)
Alkaline Phosphatase: 101 (ref 25–125)
Bilirubin, Direct: 0.21
Bilirubin, Total: 0.7

## 2020-11-26 LAB — LIPID PANEL
Cholesterol: 119 (ref 0–200)
HDL: 39 (ref 35–70)
LDL Cholesterol: 66
Triglycerides: 66 (ref 40–160)

## 2020-11-26 LAB — TSH: TSH: 0.05 — AB (ref <=5.90)

## 2020-11-26 LAB — VITAMIN D 25 HYDROXY (VIT D DEFICIENCY, FRACTURES): Vit D, 25-Hydroxy: 37.4

## 2020-11-29 ENCOUNTER — Telehealth: Payer: Self-pay | Admitting: "Endocrinology

## 2020-11-29 NOTE — Telephone Encounter (Signed)
Noted  

## 2020-11-29 NOTE — Telephone Encounter (Signed)
Melissa a nurse with [facility] states they checked patient thyroid function and will be sending over the results for [place] to review.

## 2020-11-30 NOTE — Telephone Encounter (Signed)
Received labs, abstracted the labs that I could and placed the lab results in the lab folder.

## 2021-01-18 ENCOUNTER — Other Ambulatory Visit (HOSPITAL_COMMUNITY)
Admission: RE | Admit: 2021-01-18 | Discharge: 2021-01-18 | Disposition: A | Discharge status: Home / Self Care | Payer: Medicare Other | Source: Ambulatory Visit | Attending: "Endocrinology | Admitting: "Endocrinology

## 2021-01-18 DIAGNOSIS — E1165 Type 2 diabetes mellitus with hyperglycemia: Secondary | ICD-10-CM | POA: Insufficient documentation

## 2021-01-18 LAB — COMPREHENSIVE METABOLIC PANEL
ALT: 13 U/L (ref 0–44)
AST: 19 U/L (ref 15–41)
Albumin: 3.1 g/dL — ABNORMAL LOW (ref 3.5–5.0)
Alkaline Phosphatase: 78 U/L (ref 38–126)
Anion gap: 7 (ref 5–15)
BUN: 33 mg/dL — ABNORMAL HIGH (ref 8–23)
CO2: 25 mmol/L (ref 22–32)
Calcium: 9.2 mg/dL (ref 8.9–10.3)
Chloride: 106 mmol/L (ref 98–111)
Creatinine, Ser: 1.84 mg/dL — ABNORMAL HIGH (ref 0.44–1.00)
GFR, Estimated: 29 mL/min — ABNORMAL LOW (ref >60)
Glucose, Bld: 109 mg/dL — ABNORMAL HIGH (ref 70–99)
Potassium: 3.6 mmol/L (ref 3.5–5.1)
Sodium: 138 mmol/L (ref 135–145)
Total Bilirubin: 1.1 mg/dL (ref 0.3–1.2)
Total Protein: 6.7 g/dL (ref 6.5–8.1)

## 2021-01-18 LAB — TSH: TSH: 0.362 u[IU]/mL (ref 0.350–4.500)

## 2021-01-18 LAB — LIPID PANEL
Cholesterol: 156 mg/dL (ref 0–200)
HDL: 42 mg/dL (ref >40)
LDL Cholesterol: 98 mg/dL (ref 0–99)
Total CHOL/HDL Ratio: 3.7 RATIO
Triglycerides: 80 mg/dL (ref <150)
VLDL: 16 mg/dL (ref 0–40)

## 2021-01-18 LAB — T4, FREE: Free T4: 1.03 ng/dL (ref 0.61–1.12)

## 2021-02-10 ENCOUNTER — Encounter: Payer: Self-pay | Admitting: "Endocrinology

## 2021-02-10 ENCOUNTER — Other Ambulatory Visit: Payer: Self-pay

## 2021-02-10 ENCOUNTER — Ambulatory Visit (INDEPENDENT_AMBULATORY_CARE_PROVIDER_SITE_OTHER): Payer: Medicare Other | Admitting: "Endocrinology

## 2021-02-10 VITALS — BP 130/84 | HR 60 | Ht 63.0 in | Wt 155.2 lb

## 2021-02-10 DIAGNOSIS — E782 Mixed hyperlipidemia: Secondary | ICD-10-CM

## 2021-02-10 DIAGNOSIS — I10 Essential (primary) hypertension: Secondary | ICD-10-CM | POA: Diagnosis not present

## 2021-02-10 DIAGNOSIS — E1165 Type 2 diabetes mellitus with hyperglycemia: Secondary | ICD-10-CM | POA: Diagnosis not present

## 2021-02-10 LAB — POCT GLYCOSYLATED HEMOGLOBIN (HGB A1C): HbA1c, POC (controlled diabetic range): 6 % (ref 0.0–7.0)

## 2021-02-10 MED ORDER — ACCU-CHEK GUIDE ME W/DEVICE KIT: 1.0000 | PACK | 0 refills | Status: AC

## 2021-02-10 MED ORDER — ACCU-CHEK GUIDE VI STRP: ORAL_STRIP | 2 refills | Status: AC

## 2021-02-10 NOTE — Progress Notes (Signed)
02/10/2021, 2:59 PM   Endocrinology follow-up note  Subjective:    Patient ID: Gloria Webb, female    DOB: [birth year].  Gloria Webb is being seen in follow-up after she was seen in consultation for management of type 2 diabetes, hyperlipidemia, hypertension.    PCP: Thornton Papas, PA-C.   Past Medical History:  Diagnosis Date   Diabetes mellitus, type II ([place])    Hypertension    Thyroid disease     Past Surgical History:  Procedure Laterality Date   broken ankle      Social History   Socioeconomic History   Marital status: Divorced    Spouse name: Not on file   Number of children: Not on file   Years of education: Not on file   Highest education level: Not on file  Occupational History   Not on file  Tobacco Use   Smoking status: Never   Smokeless tobacco: Not on file  Vaping Use   Vaping Use: Never used  Substance and Sexual Activity   Alcohol use: Never   Drug use: Never   Sexual activity: Not on file  Other Topics Concern   Not on file  Social History Narrative   Not on file   Social Determinants of Health   Financial Resource Strain: Not on file  Food Insecurity: Not on file  Transportation Needs: Not on file  Physical Activity: Not on file  Stress: Not on file  Social Connections: Not on file    Family History  Problem Relation Age of Onset   Hypertension Mother     Outpatient Encounter Medications as of 02/10/2021  Medication Sig   AMLODIPINE BESYLATE PO Take by mouth daily.   Blood Glucose Monitoring Suppl (ACCU-CHEK GUIDE ME) w/Device KIT 1 Piece by Does not apply route as directed.   glucose blood (ACCU-CHEK GUIDE) test strip Use as instructed   atorvastatin (LIPITOR) 20 MG tablet Take 20 mg by mouth daily.   carvedilol (COREG) 25 MG tablet Take 25 mg by mouth 2 (two) times daily.   Cholecalciferol 50 MCG (2000 UT) CAPS Take 1 capsule (2,000 Units total) by  mouth daily with breakfast. (Patient taking differently: Take 1 capsule by mouth. 1 tablet by mouth two times a month)   hydrALAZINE (APRESOLINE) 50 MG tablet Take 50 mg by mouth 2 (two) times daily.   insulin aspart (NOVOLOG) 100 unit/mL injection Inject 1 Units into the skin 3 (three) times daily before meals.   LEVEMIR FLEXTOUCH 100 UNIT/ML FlexPen Inject 20 Units into the skin at bedtime.   losartan (COZAAR) 100 MG tablet Take 100 mg by mouth daily.   [DISCONTINUED] losartan (COZAAR) 25 MG tablet Take 50 mg by mouth daily.   No facility-administered encounter medications on file as of 02/10/2021.    ALLERGIES: Allergies  Allergen Reactions   Methimazole Swelling    Pt states she experienced swelling of her face and tongue    VACCINATION STATUS:  There is no immunization history on file for this patient.  Diabetes She presents for her follow-up diabetic visit. She has type 2 diabetes mellitus. Onset time: She was diagnosed at approximate age of 30 years. Her disease course has been  improving. There are no hypoglycemic associated symptoms. Pertinent negatives for hypoglycemia include no confusion, headaches, pallor or seizures. Pertinent negatives for diabetes include no chest pain, no fatigue, no polydipsia, no polyphagia and no polyuria. There are no hypoglycemic complications. Symptoms are worsening. Diabetic complications include nephropathy. Risk factors for coronary artery disease include diabetes mellitus, dyslipidemia, family history, obesity, hypertension, sedentary lifestyle and post-menopausal. Current diabetic treatment includes insulin injections. Her weight is decreasing steadily. She is following a generally unhealthy diet. When asked about meal planning, she reported none. She has not had a previous visit with a dietitian. She never participates in exercise. Her home blood glucose trend is decreasing steadily. Her breakfast blood glucose range is generally 110-130 mg/dl. Her  lunch blood glucose range is generally 110-130 mg/dl. Her dinner blood glucose range is generally 110-130 mg/dl. Her bedtime blood glucose range is generally 110-130 mg/dl. Her overall blood glucose range is 110-130 mg/dl. (She presents with her CGM device showing 85% time range, 9% mild hypoglycemia.  She has no major hypoglycemia.  Her point-of-care A1c is 6%.    ) An ACE inhibitor/angiotensin II receptor blocker is being taken. Eye exam is current.  Hyperlipidemia This is a chronic problem. The current episode started more than 1 year ago. The problem is controlled. Exacerbating diseases include chronic renal disease, diabetes and obesity. Pertinent negatives include no chest pain, myalgias or shortness of breath. Current antihyperlipidemic treatment includes statins. Risk factors for coronary artery disease include diabetes mellitus, hypertension, obesity, a sedentary lifestyle, post-menopausal and family history.  Hypertension This is a chronic problem. The current episode started more than 1 year ago. Pertinent negatives include no chest pain, headaches, palpitations or shortness of breath. Risk factors for coronary artery disease include dyslipidemia, diabetes mellitus, obesity, sedentary lifestyle, family history and post-menopausal state. Past treatments include angiotensin blockers, beta blockers, calcium channel blockers and direct vasodilators. Identifiable causes of hypertension include chronic renal disease.    Review of Systems  Constitutional:  Negative for chills, fatigue, fever and unexpected weight change.  HENT:  Negative for trouble swallowing and voice change.   Eyes:  Negative for visual disturbance.  Respiratory:  Negative for cough, shortness of breath and wheezing.   Cardiovascular:  Negative for chest pain, palpitations and leg swelling.  Gastrointestinal:  Negative for diarrhea, nausea and vomiting.  Endocrine: Negative for cold intolerance, heat intolerance, polydipsia,  polyphagia and polyuria.  Musculoskeletal:  Negative for arthralgias and myalgias.  Skin:  Negative for color change, pallor, rash and wound.  Neurological:  Negative for seizures and headaches.  Psychiatric/Behavioral:  Negative for confusion and suicidal ideas.    Objective:    Vitals with BMI 02/10/2021 09/30/2020 06/28/2020  Height '5\' 3"'  '5\' 3"'  '5\' 3"'   Weight 155 lbs 3 oz 160 lbs 13 oz 166 lbs 13 oz  BMI 27.5 11.65 79.03  Systolic 833 383 291  Diastolic 84 78 68  Pulse 60 68 72    BP 130/84    Pulse 60    Ht '5\' 3"'  (1.6 m)    Wt 155 lb 3.2 oz (70.4 kg)    BMI 27.49 kg/m   Wt Readings from Last 3 Encounters:  02/10/21 155 lb 3.2 oz (70.4 kg)  09/30/20 160 lb 12.8 oz (72.9 kg)  06/28/20 166 lb 12.8 oz (75.7 kg)     Physical Exam Constitutional:      Appearance: She is well-developed.  HENT:     Head: Normocephalic and atraumatic.  Neck:  Thyroid: No thyromegaly.     Trachea: No tracheal deviation.  Cardiovascular:     Rate and Rhythm: Normal rate.  Pulmonary:     Effort: Pulmonary effort is normal.  Abdominal:     Tenderness: There is no abdominal tenderness. There is no guarding.  Musculoskeletal:        General: Normal range of motion.     Cervical back: Normal range of motion and neck supple.  Skin:    General: Skin is warm and dry.     Coloration: Skin is not pale.     Findings: No erythema or rash.  Neurological:     Mental Status: She is alert and oriented to person, place, and time.     Cranial Nerves: No cranial nerve deficit.     Coordination: Coordination normal.     Deep Tendon Reflexes: Reflexes are normal and symmetric.  Psychiatric:        Judgment: Judgment normal.    CMP ( most recent) CMP     Component Value Date/Time   NA 138 01/18/2021 1027   K 3.6 01/18/2021 1027   CL 106 01/18/2021 1027   CO2 25 01/18/2021 1027   GLUCOSE 109 (H) 01/18/2021 1027   BUN 33 (H) 01/18/2021 1027   BUN 35 (A) 06/27/2019 0000   CREATININE 1.84 (H)  01/18/2021 1027   CALCIUM 9.2 01/18/2021 1027   PROT 6.7 01/18/2021 1027   ALBUMIN 3.1 (L) 01/18/2021 1027   AST 19 01/18/2021 1027   ALT 13 01/18/2021 1027   ALKPHOS 78 01/18/2021 1027   BILITOT 1.1 01/18/2021 1027   GFRNONAA 29 (L) 01/18/2021 1027   GFRAA 32 06/27/2019 0000     Diabetic Labs (most recent): Lab Results  Component Value Date   HGBA1C 6.0 02/10/2021   HGBA1C 6.3 09/30/2020   HGBA1C 6.4 05/20/2020     Lipid Panel ( most recent) Lipid Panel     Component Value Date/Time   CHOL 156 01/18/2021 1027   TRIG 80 01/18/2021 1027   HDL 42 01/18/2021 1027   CHOLHDL 3.7 01/18/2021 1027   VLDL 16 01/18/2021 1027   LDLCALC 98 01/18/2021 1027      Lab Results  Component Value Date   TSH 0.362 01/18/2021   TSH 0.05 (A) 11/26/2020   TSH 0.265 (L) 12/30/2019   TSH 0.062 (L) 10/09/2019   TSH 7.30 (A) 06/27/2019   FREET4 1.03 01/18/2021   FREET4 1.01 12/30/2019   FREET4 1.72 10/09/2019      Assessment & Plan:   1. Uncontrolled type 2 diabetes mellitus with hyperglycemia (HCC)  - Gloria Webb has currently uncontrolled symptomatic type 2 DM since  71 years of age.  She presents with her CGM device showing 85% time range, 9% mild hypoglycemia.  She has no major hypoglycemia.  Her point-of-care A1c is 6%.      Recent labs reviewed. - I had a long discussion with her about the progressive nature of diabetes and the pathology behind its complications. -her diabetes is complicated by CKD, obesity/sedentary life and she remains at a high risk for more acute and chronic complications which include CAD, CVA, CKD, retinopathy, and neuropathy. These are all discussed in detail with her.  - I have counseled her on diet  and weight management  by adopting a carbohydrate restricted/protein rich diet. Patient is encouraged to switch to  unprocessed or minimally processed     complex starch and increased protein intake (animal or plant source), fruits, and  vegetables. -  she  is advised to stick to a routine mealtimes to eat 3 meals  a day and avoid unnecessary snacks ( to snack only to correct hypoglycemia).    - she acknowledges that there is a room for improvement in her food and drink choices. - Suggestion is made for her to avoid simple carbohydrates  from her diet including Cakes, Sweet Desserts, Ice Cream, Soda (diet and regular), Sweet Tea, Candies, Chips, Cookies, Store Bought Juices, Alcohol in Excess of  1-2 drinks a day, Artificial Sweeteners,  Coffee Creamer, and "Sugar-free" Products, Lemonade. This will help patient to have more stable blood glucose profile and potentially avoid unintended weight gain. - she will be scheduled with Jearld Fenton, RDN, CDE for diabetes education.  - I have approached her with the following individualized plan to manage  her diabetes and patient agrees:   -In light of her presentation with tightening glycemic profile and the fact that she used only 20 units of Levemir and NovoLog only on rare basis, she is given simplified regimen.  She is advised to continue Levemir 20 units nightly, discontinue NovoLog for now.    -She will continue to monitor blood glucose using her CGM , she is encouraged to call clinic for blood glucose levels less than 70 or above 150 mg /dl.   - Specific targets for  A1c;  LDL, HDL,  and Triglycerides were discussed with the patient.  2) Blood Pressure /Hypertension:  -Her blood pressure is controlled to target.  - she is advised to continue her current medications including losartan 25 mg p.o. daily, carvedilol 25 mg p.o. twice daily, hydralazine 50 mg p.o. twice daily.  -She was given potassium supplement which has helped correct her potassium to 4 mmol/L.  She is advised to discontinue potassium.      3) Lipids/Hyperlipidemia:   Review of her recent lipid panel showed  controlled  LDL at 85 .  she  is advised to continue atorvastatin 20 mg p.o. daily at bedtime.    -  Side effects and  precautions discussed with her.  4)  Weight/Diet:  Body mass index is 27.49 kg/m.  -   clearly complicating her diabetes care.   she is  a candidate for weight loss. I discussed with her the fact that loss of 5 - 10% of her  current body weight will have the most impact on her diabetes management.  Exercise, and detailed carbohydrates information provided  -  detailed on discharge instructions.  5) toxic multinodular goiter: -Her medical records were reviewed.  She underwent bilateral fine-needle aspiration of nodules on both lobes of the thyroid with benign findings.  She was treated adequately with what appears to be methimazole until 5 months ago.  Her recent labs are consistent with euthyroid state.  And her 24-hour uptake was 25.3% on the background of enlarged multinodular thyroid lobe with multiple small hard nodules seen within both lobes consistent with hyperfunctional adenomas.   She will not need ablative treatment.  However she will be considered for thyroid ultrasound after her next visit.   -Her thyroid function tests are consistent with euthyroid presentation.   6) Chronic Care/Health Maintenance:  -she  is on ACEI/ARB and Statin medications and  is encouraged to initiate and continue to follow up with Ophthalmology, Dentist,  Podiatrist at least yearly or according to recommendations, and advised to   stay away from smoking. I have recommended yearly flu vaccine and pneumonia vaccine at least  every 5 years; moderate intensity exercise for up to 150 minutes weekly; and  sleep for at least 7 hours a day.  She did have normal screening point-of-care ABI in December 2022.   This study will be repeated in December 2026, or sooner if needed. - she is  advised to maintain close follow up with Thornton Papas, PA-C for primary care needs, as well as her other providers for optimal and coordinated care.    I spent 40 minutes in the care of the patient today including review of labs  from [facility], Lipids, Thyroid Function, Hematology (current and previous including abstractions from other facilities); face-to-face time discussing  her blood glucose readings/logs, discussing hypoglycemia and hyperglycemia episodes and symptoms, medications doses, her options of short and long term treatment based on the latest standards of care / guidelines;  discussion about incorporating lifestyle medicine;  and documenting the encounter.    Please refer to Patient Instructions for Blood Glucose Monitoring and Insulin/Medications Dosing Guide"  in media tab for additional information. Please  also refer to " Patient Self Inventory" in the Media  tab for reviewed elements of pertinent patient history.  Gloria Webb participated in the discussions, expressed understanding, and voiced agreement with the above plans.  All questions were answered to her satisfaction. she is encouraged to contact clinic should she have any questions or concerns prior to her return visit.     Follow up plan: - Return in about 4 months (around 06/10/2021) for Bring Meter and Logs- A1c in Office.  Glade Lloyd, MD [facility] [address] [place], [place] [address] Phone: [phone]  Fax: [phone]    02/10/2021, 2:59 PM  This note was partially dictated with voice recognition software. Similar sounding words can be transcribed inadequately or may not  be corrected upon review.

## 2021-02-10 NOTE — Patient Instructions (Signed)

## 2021-02-17 ENCOUNTER — Encounter: Payer: Self-pay | Admitting: Internal Medicine

## 2021-03-30 ENCOUNTER — Encounter: Payer: Self-pay | Admitting: *Deleted

## 2021-03-30 ENCOUNTER — Ambulatory Visit (INDEPENDENT_AMBULATORY_CARE_PROVIDER_SITE_OTHER): Payer: Medicare Other | Admitting: Internal Medicine

## 2021-03-30 ENCOUNTER — Other Ambulatory Visit: Payer: Self-pay

## 2021-03-30 ENCOUNTER — Encounter: Payer: Self-pay | Admitting: Internal Medicine

## 2021-03-30 VITALS — BP 124/75 | HR 68 | Temp 97.1°F | Ht 63.0 in | Wt 158.2 lb

## 2021-03-30 DIAGNOSIS — K861 Other chronic pancreatitis: Secondary | ICD-10-CM | POA: Diagnosis not present

## 2021-03-30 DIAGNOSIS — K635 Polyp of colon: Secondary | ICD-10-CM | POA: Diagnosis not present

## 2021-03-30 DIAGNOSIS — R634 Abnormal weight loss: Secondary | ICD-10-CM | POA: Diagnosis not present

## 2021-03-30 DIAGNOSIS — Z8719 Personal history of other diseases of the digestive system: Secondary | ICD-10-CM

## 2021-03-30 DIAGNOSIS — R630 Anorexia: Secondary | ICD-10-CM

## 2021-03-30 NOTE — Patient Instructions (Signed)
I am going to request your records from Dr. Posey Pronto.  I am also going to request your most recent echocardiogram of your heart.  Assuming this looks okay, we will schedule you for upper endoscopy and colonoscopy to further evaluate nausea/vomiting, weight loss, poor appetite as well as evaluate previously noted polyp.  I am going to order CT of your pancreas today given your history of chronic pancreatitis.  Continue ondansetron as needed for nausea.  Further recommendations to follow.  It was very nice meeting you today.  Dr. Abbey Webb  At [facility] we value your feedback. You may receive a survey about your visit today. Please share your experience as we strive to create trusting relationships with our patients to provide genuine, compassionate, quality care.  We appreciate your understanding and patience as we review any laboratory studies, imaging, and other diagnostic tests that are ordered as we care for you. Our office policy is 5 business days for review of these results, and any emergent or urgent results are addressed in a timely manner for your best interest. If you do not hear from our office in 1 week, please contact us.   We also encourage the use of MyChart, which contains your medical information for your review as well. If you are not enrolled in this feature, an access code is on this after visit summary for your convenience. Thank you for allowing [place] to be involved in your care.  It was great to see you today!  I hope you have a great rest of your Winter!    Gloria Webb, D.O. Gastroenterology and Hepatology [facility]

## 2021-03-30 NOTE — Progress Notes (Signed)
Primary Care Physician:  Thornton Papas, PA-C Primary Gastroenterologist:  Dr. Abbey Chatters  Chief Complaint  Patient presents with   Diarrhea    occ   vomiting    occ   Weight Loss    Lost 60-70 lbs in past 2 years   Colonoscopy    Last tcs 2021 in [place]    HPI:   Gloria Webb is a 71 y.o. female who presents today by referral from her PCP Thornton Papas for evaluation.  She has a complex GI history and multiple GI complaints for me today.  She reports intermittent nausea and vomiting x15 years.  States she was diagnosed with chronic pancreatitis in the past.  Denies any imaging of her pancreas in over 2 years.  No recent or previous history of alcohol abuse.  States she was told this was due to medication effect?  Gallbladder in situ.  According to referral, patient underwent colonoscopy February 2021 with Dr. Posey Pronto in [place] showed polyp around the hepatic flexure seen between folds difficult to access.  Recommended repeat in 6 to 12 months.  This has not been done.  Patient notes poor appetite and intermittent nausea and vomiting.  States every 1 to 2 months she will have flare of her pancreatitis where she will have 2 to 3 days of nausea and vomiting.  States she takes ondansetron around-the-clock during this time which seems to help.  No chronic abdominal pain.  No melena hematochezia.  Past Medical History:  Diagnosis Date   Diabetes mellitus, type II ([place])    Hypertension    Thyroid disease     Past Surgical History:  Procedure Laterality Date   broken ankle      Current Outpatient Medications  Medication Sig Dispense Refill   AMLODIPINE BESYLATE PO Take 5 mg by mouth daily.     atorvastatin (LIPITOR) 20 MG tablet Take 20 mg by mouth daily.     Blood Glucose Monitoring Suppl (ACCU-CHEK GUIDE ME) w/Device KIT 1 Piece by Does not apply route as directed. 1 kit 0   carvedilol (COREG) 25 MG tablet Take 25 mg by mouth 2 (two) times daily.     cetirizine  (ZYRTEC) 10 MG tablet Take 10 mg by mouth daily.     empagliflozin (JARDIANCE) 10 MG TABS tablet Take 1 tablet by mouth daily.     glucose blood (ACCU-CHEK GUIDE) test strip Use as instructed 100 each 2   hydrALAZINE (APRESOLINE) 50 MG tablet Take 50 mg by mouth 2 (two) times daily.     insulin aspart (NOVOLOG) 100 unit/mL injection Inject 1 Units into the skin daily.     LEVEMIR FLEXTOUCH 100 UNIT/ML FlexPen Inject 30 Units into the skin at bedtime.     losartan (COZAAR) 100 MG tablet Take 100 mg by mouth daily.     ondansetron (ZOFRAN) 4 MG tablet Take 4 mg by mouth every 8 (eight) hours as needed for nausea or vomiting.     Vitamin D, Ergocalciferol, (DRISDOL) 1.25 MG (50000 UNIT) CAPS capsule Take 50,000 Units by mouth every 14 (fourteen) days.     Cholecalciferol 50 MCG (2000 UT) CAPS Take 1 capsule (2,000 Units total) by mouth daily with breakfast. (Patient not taking: Reported on 03/30/2021) 90 capsule 3   No current facility-administered medications for this visit.    Allergies as of 03/30/2021 - Review Complete 03/30/2021  Allergen Reaction Noted   Methimazole Swelling 09/11/2019    Family History  Problem Relation Age of Onset  Hypertension Mother     Social History   Socioeconomic History   Marital status: Divorced    Spouse name: Not on file   Number of children: Not on file   Years of education: Not on file   Highest education level: Not on file  Occupational History   Not on file  Tobacco Use   Smoking status: Former    Types: Cigarettes   Smokeless tobacco: Not on file  Vaping Use   Vaping Use: Never used  Substance and Sexual Activity   Alcohol use: Not Currently   Drug use: Not Currently   Sexual activity: Not on file  Other Topics Concern   Not on file  Social History Narrative   Not on file   Social Determinants of Health   Financial Resource Strain: Not on file  Food Insecurity: Not on file  Transportation Needs: Not on file  Physical  Activity: Not on file  Stress: Not on file  Social Connections: Not on file  Intimate Partner Violence: Not on file    Subjective: Review of Systems  Constitutional:  Negative for chills and fever.  HENT:  Negative for congestion and hearing loss.   Eyes:  Negative for blurred vision and double vision.  Respiratory:  Negative for cough and shortness of breath.   Cardiovascular:  Negative for chest pain and palpitations.  Gastrointestinal:  Positive for nausea and vomiting. Negative for abdominal pain, blood in stool, constipation, diarrhea, heartburn and melena.  Genitourinary:  Negative for dysuria and urgency.  Musculoskeletal:  Negative for joint pain and myalgias.  Skin:  Negative for itching and rash.  Neurological:  Negative for dizziness and headaches.  Psychiatric/Behavioral:  Negative for depression. The patient is not nervous/anxious.       Objective: BP 124/75    Pulse 68    Temp (!) 97.1 F (36.2 C) (Temporal)    Ht '5\' 3"'  (1.6 m)    Wt 158 lb 3.2 oz (71.8 kg)    BMI 28.02 kg/m  Physical Exam Constitutional:      Appearance: Normal appearance.  HENT:     Head: Normocephalic and atraumatic.  Eyes:     Extraocular Movements: Extraocular movements intact.     Conjunctiva/sclera: Conjunctivae normal.  Cardiovascular:     Rate and Rhythm: Normal rate and regular rhythm.     Heart sounds: Murmur heard.  Pulmonary:     Effort: Pulmonary effort is normal.     Breath sounds: Normal breath sounds.  Abdominal:     General: Bowel sounds are normal.     Palpations: Abdomen is soft.  Musculoskeletal:        General: No swelling. Normal range of motion.     Cervical back: Normal range of motion and neck supple.  Skin:    General: Skin is warm and dry.     Coloration: Skin is not jaundiced.  Neurological:     General: No focal deficit present.     Mental Status: She is alert and oriented to person, place, and time.  Psychiatric:        Mood and Affect: Mood normal.         Behavior: Behavior normal.     Assessment: *Nausea and vomiting-intermittent *Chronic pancreatitis *Poor appetite with weight loss *Colon polyp  Plan: Patient with multiple GI complaints and complex past GI history.  I will request records from patient's previous gastroenterologist Dr. Posey Pronto including colonoscopy and pathology reports.  I do believe patient would benefit  from upper endoscopy to further evaluate her intermittent nausea vomiting weight loss as well as colonoscopy to be performed at the same time given history of polyp at the hepatic flexure.  We will request most recent echocardiogram prior to scheduling these procedures.  Heart murmur noted on physical exam.  Patient denies having any imaging in over 2 to 3 years.  I do not have any imaging of her pancreas at my disposal currently.  Will order CT pancreas protocol to further evaluate.  May require reduced dose given CKD.  Continue ondansetron as needed for nausea and vomiting.  Further recommendations to follow.  Thank you Thornton Papas for the kind referral for  03/30/2021 11:42 AM   Disclaimer: This note was dictated with voice recognition software. Similar sounding words can inadvertently be transcribed and may not be corrected upon review.

## 2021-04-05 LAB — HEPATIC FUNCTION PANEL
ALT: 14 U/L (ref 7–35)
AST: 22 (ref 13–35)
Alkaline Phosphatase: 112 (ref 25–125)
Bilirubin, Total: 0.4

## 2021-04-05 LAB — BASIC METABOLIC PANEL
BUN: 36 — AB (ref 4–21)
CO2: 23 — AB (ref 13–22)
Chloride: 105 (ref 99–108)
Creatinine: 2.1 — AB (ref 0.5–1.1)
Glucose: 109
Potassium: 4.8 mEq/L (ref 3.5–5.1)
Sodium: 142 (ref 137–147)

## 2021-04-05 LAB — COMPREHENSIVE METABOLIC PANEL
Albumin: 3.5 (ref 3.5–5.0)
Calcium: 9.5 (ref 8.7–10.7)
Globulin: 3

## 2021-04-05 LAB — TSH: TSH: 0.38 — AB (ref 0.41–5.90)

## 2021-04-05 LAB — VITAMIN D 25 HYDROXY (VIT D DEFICIENCY, FRACTURES): Vit D, 25-Hydroxy: 30.2

## 2021-04-06 LAB — LIPID PANEL
Cholesterol: 162 (ref 0–200)
HDL: 57 (ref 35–70)
LDL Cholesterol: 94
Triglycerides: 51 (ref 40–160)

## 2021-04-14 ENCOUNTER — Other Ambulatory Visit (HOSPITAL_COMMUNITY)
Admission: RE | Admit: 2021-04-14 | Discharge: 2021-04-14 | Disposition: A | Discharge status: Home / Self Care | Payer: Medicare Other | Source: Ambulatory Visit | Attending: Internal Medicine | Admitting: Internal Medicine

## 2021-04-14 DIAGNOSIS — Z8719 Personal history of other diseases of the digestive system: Secondary | ICD-10-CM | POA: Diagnosis present

## 2021-04-14 DIAGNOSIS — K861 Other chronic pancreatitis: Secondary | ICD-10-CM | POA: Diagnosis present

## 2021-04-14 LAB — BUN: BUN: 56 mg/dL — ABNORMAL HIGH (ref 8–23)

## 2021-04-14 LAB — CREATININE, SERUM
Creatinine, Ser: 2.38 mg/dL — ABNORMAL HIGH (ref 0.44–1.00)
GFR, Estimated: 21 mL/min — ABNORMAL LOW (ref >60)

## 2021-04-19 ENCOUNTER — Other Ambulatory Visit: Payer: Self-pay

## 2021-04-19 ENCOUNTER — Ambulatory Visit (HOSPITAL_COMMUNITY)
Admission: RE | Admit: 2021-04-19 | Discharge: 2021-04-19 | Disposition: A | Discharge status: Home / Self Care | Payer: Medicare Other | Source: Ambulatory Visit | Attending: Internal Medicine | Admitting: Internal Medicine

## 2021-04-19 DIAGNOSIS — Z8719 Personal history of other diseases of the digestive system: Secondary | ICD-10-CM | POA: Diagnosis not present

## 2021-04-19 DIAGNOSIS — K861 Other chronic pancreatitis: Secondary | ICD-10-CM | POA: Insufficient documentation

## 2021-06-13 ENCOUNTER — Encounter: Payer: Self-pay | Admitting: "Endocrinology

## 2021-06-13 ENCOUNTER — Ambulatory Visit (INDEPENDENT_AMBULATORY_CARE_PROVIDER_SITE_OTHER): Payer: Medicare Other | Admitting: "Endocrinology

## 2021-06-13 VITALS — BP 122/78 | HR 60 | Ht 63.0 in | Wt 166.8 lb

## 2021-06-13 DIAGNOSIS — I10 Essential (primary) hypertension: Secondary | ICD-10-CM

## 2021-06-13 DIAGNOSIS — E782 Mixed hyperlipidemia: Secondary | ICD-10-CM

## 2021-06-13 DIAGNOSIS — E1165 Type 2 diabetes mellitus with hyperglycemia: Secondary | ICD-10-CM

## 2021-06-13 LAB — POCT GLYCOSYLATED HEMOGLOBIN (HGB A1C): HbA1c, POC (controlled diabetic range): 7.2 % — AB (ref 0.0–7.0)

## 2021-06-13 NOTE — Patient Instructions (Signed)

## 2021-06-13 NOTE — Progress Notes (Addendum)
? ?                                              ?     06/13/2021, 3:00 PM ? ? ?Endocrinology follow-up note ? ?Subjective:  ? ? Patient ID: Gloria Webb, female    DOB: 1950-08-14.  ?Gloria Webb is being seen in follow-up after she was seen in consultation for management of type 2 diabetes, hyperlipidemia, hypertension.   ? ?PCP: Thornton Papas, PA-C. ? ? ?Past Medical History:  ?Diagnosis Date  ? Diabetes mellitus, type II ([place])   ? Hypertension   ? Thyroid disease   ? ? ?Past Surgical History:  ?Procedure Laterality Date  ? broken ankle    ? ? ?Social History  ? ?Socioeconomic History  ? Marital status: Divorced  ?  Spouse name: Not on file  ? Number of children: Not on file  ? Years of education: Not on file  ? Highest education level: Not on file  ?Occupational History  ? Not on file  ?Tobacco Use  ? Smoking status: Former  ?  Types: Cigarettes  ? Smokeless tobacco: Not on file  ?Vaping Use  ? Vaping Use: Never used  ?Substance and Sexual Activity  ? Alcohol use: Not Currently  ? Drug use: Not Currently  ? Sexual activity: Not on file  ?Other Topics Concern  ? Not on file  ?Social History Narrative  ? Not on file  ? ?Social Determinants of Health  ? ?Financial Resource Strain: Not on file  ?Food Insecurity: Not on file  ?Transportation Needs: Not on file  ?Physical Activity: Not on file  ?Stress: Not on file  ?Social Connections: Not on file  ? ? ?Family History  ?Problem Relation Age of Onset  ? Hypertension Mother   ? ? ?Outpatient Encounter Medications as of 06/13/2021  ?Medication Sig  ? AMLODIPINE BESYLATE PO Take 5 mg by mouth daily.  ? atorvastatin (LIPITOR) 20 MG tablet Take 20 mg by mouth daily.  ? Blood Glucose Monitoring Suppl (ACCU-CHEK GUIDE ME) w/Device KIT 1 Piece by Does not apply route as directed.  ? carvedilol (COREG) 25 MG tablet Take 25 mg by mouth 2 (two) times daily.  ? cetirizine (ZYRTEC) 10 MG tablet Take 10 mg by mouth daily.  ? glucose blood (ACCU-CHEK GUIDE)  test strip Use as instructed  ? hydrALAZINE (APRESOLINE) 50 MG tablet Take 50 mg by mouth 2 (two) times daily.  ? JARDIANCE 10 MG TABS tablet Take 10 mg by mouth every morning.  ? LEVEMIR FLEXTOUCH 100 UNIT/ML FlexPen Inject 20 Units into the skin at bedtime.  ? losartan (COZAAR) 100 MG tablet Take 100 mg by mouth daily.  ? ondansetron (ZOFRAN) 4 MG tablet Take 4 mg by mouth every 8 (eight) hours as needed for nausea or vomiting.  ? Vitamin D, Ergocalciferol, (DRISDOL) 1.25 MG (50000 UNIT) CAPS capsule Take 50,000 Units by mouth every 14 (fourteen) days.  ? [DISCONTINUED] Cholecalciferol 50 MCG (2000 UT) CAPS Take 1 capsule (2,000 Units total) by mouth daily with breakfast. (Patient not taking: Reported on 03/30/2021)  ? [DISCONTINUED] insulin aspart (NOVOLOG) 100 unit/mL injection Inject 1 Units into the skin daily.  ? ?No facility-administered encounter medications on file as of 06/13/2021.  ? ? ?ALLERGIES: ?Allergies  ?Allergen Reactions  ? Methimazole Swelling  ?  Pt states she experienced swelling  of her face and tongue  ? ? ?VACCINATION STATUS: ? ?There is no immunization history on file for this patient. ? ?Diabetes ?She presents for her follow-up diabetic visit. She has type 2 diabetes mellitus. Onset time: She was diagnosed at approximate age of 7 years. Her disease course has been worsening. There are no hypoglycemic associated symptoms. Pertinent negatives for hypoglycemia include no confusion, headaches, pallor or seizures. Pertinent negatives for diabetes include no chest pain, no fatigue, no polydipsia, no polyphagia and no polyuria. There are no hypoglycemic complications. Symptoms are worsening. Diabetic complications include nephropathy. Risk factors for coronary artery disease include diabetes mellitus, dyslipidemia, family history, obesity, hypertension, sedentary lifestyle and post-menopausal. Current diabetic treatment includes insulin injections. Her weight is increasing steadily. She is  following a generally unhealthy diet. When asked about meal planning, she reported none. She has not had a previous visit with a dietitian. She never participates in exercise. Her home blood glucose trend is increasing steadily. Her breakfast blood glucose range is generally 110-130 mg/dl. Her lunch blood glucose range is generally 110-130 mg/dl. Her dinner blood glucose range is generally 110-130 mg/dl. Her bedtime blood glucose range is generally 110-130 mg/dl. Her overall blood glucose range is 110-130 mg/dl. (She presents with her CGM showing 65% TIR, 22% level 1 hyperglycemia, 9% level 2 hyperglycemia, 4% level 1 hypoglycemia .  For the last 14 days, average blood glucose 156. ?Her point-of-care A1c is 7.2%, increasing from 6%.  She is initiated on Jardiance 10 mg p.o. daily at breakfast by her nephrologist.   ? ? ?) An ACE inhibitor/angiotensin II receptor blocker is being taken. Eye exam is current.  ?Hyperlipidemia ?This is a chronic problem. The current episode started more than 1 year ago. The problem is controlled. Exacerbating diseases include chronic renal disease, diabetes and obesity. Pertinent negatives include no chest pain, myalgias or shortness of breath. Current antihyperlipidemic treatment includes statins. Risk factors for coronary artery disease include diabetes mellitus, hypertension, obesity, a sedentary lifestyle, post-menopausal and family history.  ?Hypertension ?This is a chronic problem. The current episode started more than 1 year ago. Pertinent negatives include no chest pain, headaches, palpitations or shortness of breath. Risk factors for coronary artery disease include dyslipidemia, diabetes mellitus, obesity, sedentary lifestyle, family history and post-menopausal state. Past treatments include angiotensin blockers, beta blockers, calcium channel blockers and direct vasodilators. Identifiable causes of hypertension include chronic renal disease.  ? ? ?Review of Systems   ?Constitutional:  Negative for chills, fatigue, fever and unexpected weight change.  ?HENT:  Negative for trouble swallowing and voice change.   ?Eyes:  Negative for visual disturbance.  ?Respiratory:  Negative for cough, shortness of breath and wheezing.   ?Cardiovascular:  Negative for chest pain, palpitations and leg swelling.  ?Gastrointestinal:  Negative for diarrhea, nausea and vomiting.  ?Endocrine: Negative for cold intolerance, heat intolerance, polydipsia, polyphagia and polyuria.  ?Musculoskeletal:  Negative for arthralgias and myalgias.  ?Skin:  Negative for color change, pallor, rash and wound.  ?Neurological:  Negative for seizures and headaches.  ?Psychiatric/Behavioral:  Negative for confusion and suicidal ideas.   ? ?Objective:  ?  ? ?  06/13/2021  ?  1:28 PM 03/30/2021  ? 11:19 AM 02/10/2021  ?  2:07 PM  ?Vitals with BMI  ?Height _0  _1  _2   ?Weight 166 lbs 13 oz 158 lbs 3 oz 155 lbs 3 oz  ?BMI 29.55 28.03 27.5  ?Systolic 315 400 867  ?Diastolic 78 75 84  ?Pulse  60 68 60  ? ? ?BP 122/78   Pulse 60   Ht _0  (1.6 m)   Wt 166 lb 12.8 oz (75.7 kg)   BMI 29.55 kg/m?   ?Wt Readings from Last 3 Encounters:  ?06/13/21 166 lb 12.8 oz (75.7 kg)  ?03/30/21 158 lb 3.2 oz (71.8 kg)  ?02/10/21 155 lb 3.2 oz (70.4 kg)  ?  ? ? ? ?CMP ( most recent) ?CMP  ?   ?Component Value Date/Time  ? NA 142 04/05/2021 0000  ? K 4.8 04/05/2021 0000  ? CL 105 04/05/2021 0000  ? CO2 23 (A) 04/05/2021 0000  ? GLUCOSE 109 (H) 01/18/2021 1027  ? BUN 56 (H) 04/14/2021 1155  ? BUN 36 (A) 04/05/2021 0000  ? CREATININE 2.38 (H) 04/14/2021 1155  ? CALCIUM 9.5 04/05/2021 0000  ? PROT 6.7 01/18/2021 1027  ? ALBUMIN 3.5 04/05/2021 0000  ? AST 22 04/05/2021 0000  ? ALT 14 04/05/2021 0000  ? ALKPHOS 112 04/05/2021 0000  ? BILITOT 1.1 01/18/2021 1027  ? GFRNONAA 21 (L) 04/14/2021 1155  ? GFRAA 32 06/27/2019 0000  ? ? ? ?Diabetic Labs (most recent): ?Lab Results  ?Component Value Date  ? HGBA1C 7.2 (A) 06/13/2021  ? HGBA1C 6.0  02/10/2021  ? HGBA1C 6.3 09/30/2020  ? ? ? Lipid Panel ( most recent) ?Lipid Panel  ?   ?Component Value Date/Time  ? CHOL 162 04/05/2021 0000  ? TRIG 51 04/05/2021 0000  ? HDL 57 04/05/2021 0000  ? CHOLHDL 3.7 01/18/2021 1027  ? VLD

## 2021-07-06 ENCOUNTER — Telehealth: Payer: Self-pay | Admitting: "Endocrinology

## 2021-07-06 NOTE — Telephone Encounter (Signed)
Patient left a VM stating she would like you to give her a call in regards to her BS going crazy during the day

## 2021-07-06 NOTE — Telephone Encounter (Signed)
Spoke with pt, she stated she uses her Libre CGM. I sent pt an invitation to her email to walk her through the steps of sharing her data with [place].

## 2021-07-07 NOTE — Telephone Encounter (Signed)
Pt called and said she did not receive the email. Her email is [email or web address]

## 2021-07-07 NOTE — Telephone Encounter (Signed)
Resent invitation through [place] to pt to connect and share her glucose data.

## 2021-07-08 NOTE — Telephone Encounter (Signed)
Discussed with pt, understanding voiced. 

## 2021-07-08 NOTE — Telephone Encounter (Signed)
Pt called with high BG readings.   Date Before breakfast Before lunch Before supper Bedtime  5/30 147 168 147 242  5/31 147 168 170 192  6/1 152 241 110 160  6/27 73       Pt taking: Jardiance 10mg  daily. She had been taking 20 units of levemir at bedtime but increased it herself to 22 units at bedtime for the past week.  I sent her several invitations through [place] to try to share her data but she has not been able to receive the email.

## 2021-09-06 ENCOUNTER — Other Ambulatory Visit (HOSPITAL_COMMUNITY)
Admission: RE | Admit: 2021-09-06 | Discharge: 2021-09-06 | Disposition: A | Discharge status: Home / Self Care | Payer: Medicare Other | Source: Ambulatory Visit | Attending: "Endocrinology | Admitting: "Endocrinology

## 2021-09-06 DIAGNOSIS — E1165 Type 2 diabetes mellitus with hyperglycemia: Secondary | ICD-10-CM | POA: Diagnosis present

## 2021-09-06 LAB — COMPREHENSIVE METABOLIC PANEL
ALT: 16 U/L (ref 0–44)
AST: 19 U/L (ref 15–41)
Albumin: 3.3 g/dL — ABNORMAL LOW (ref 3.5–5.0)
Alkaline Phosphatase: 87 U/L (ref 38–126)
Anion gap: 7 (ref 5–15)
BUN: 37 mg/dL — ABNORMAL HIGH (ref 8–23)
CO2: 23 mmol/L (ref 22–32)
Calcium: 9.4 mg/dL (ref 8.9–10.3)
Chloride: 109 mmol/L (ref 98–111)
Creatinine, Ser: 2.23 mg/dL — ABNORMAL HIGH (ref 0.44–1.00)
GFR, Estimated: 23 mL/min — ABNORMAL LOW (ref >60)
Glucose, Bld: 138 mg/dL — ABNORMAL HIGH (ref 70–99)
Potassium: 4 mmol/L (ref 3.5–5.1)
Sodium: 139 mmol/L (ref 135–145)
Total Bilirubin: 1.1 mg/dL (ref 0.3–1.2)
Total Protein: 7 g/dL (ref 6.5–8.1)

## 2021-09-06 LAB — TSH: TSH: 0.01 u[IU]/mL — ABNORMAL LOW (ref 0.350–4.500)

## 2021-09-06 LAB — T4, FREE: Free T4: 1.45 ng/dL — ABNORMAL HIGH (ref 0.61–1.12)

## 2021-09-14 ENCOUNTER — Encounter: Payer: Self-pay | Admitting: "Endocrinology

## 2021-09-14 ENCOUNTER — Ambulatory Visit (INDEPENDENT_AMBULATORY_CARE_PROVIDER_SITE_OTHER): Payer: Medicare Other | Admitting: "Endocrinology

## 2021-09-14 VITALS — BP 124/82 | HR 56 | Ht 63.0 in | Wt 154.0 lb

## 2021-09-14 DIAGNOSIS — E0590 Thyrotoxicosis, unspecified without thyrotoxic crisis or storm: Secondary | ICD-10-CM | POA: Insufficient documentation

## 2021-09-14 DIAGNOSIS — E782 Mixed hyperlipidemia: Secondary | ICD-10-CM

## 2021-09-14 DIAGNOSIS — I10 Essential (primary) hypertension: Secondary | ICD-10-CM

## 2021-09-14 DIAGNOSIS — E1165 Type 2 diabetes mellitus with hyperglycemia: Secondary | ICD-10-CM | POA: Diagnosis not present

## 2021-09-14 LAB — POCT GLYCOSYLATED HEMOGLOBIN (HGB A1C): HbA1c, POC (controlled diabetic range): 8.8 % — AB (ref 0.0–7.0)

## 2021-09-14 MED ORDER — NOVOLOG MIX 70/30 FLEXPEN (70-30) 100 UNIT/ML ~~LOC~~ SUPN: 20.0000 [IU] | PEN_INJECTOR | Freq: Two times a day (BID) | SUBCUTANEOUS | 2 refills | Status: DC

## 2021-09-14 NOTE — Progress Notes (Signed)
09/14/2021, 6:09 PM   Endocrinology follow-up note  Subjective:    Patient ID: Gloria Webb, female    DOB: March 01, 1950.  Gloria Webb is being seen in follow-up after she was seen in consultation for management of type 2 diabetes, hyperlipidemia, hypertension.    PCP: Thornton Papas, PA-C.   Past Medical History:  Diagnosis Date   Diabetes mellitus, type II ([place])    Hypertension    Thyroid disease     Past Surgical History:  Procedure Laterality Date   broken ankle      Social History   Socioeconomic History   Marital status: Divorced    Spouse name: Not on file   Number of children: Not on file   Years of education: Not on file   Highest education level: Not on file  Occupational History   Not on file  Tobacco Use   Smoking status: Former    Types: Cigarettes   Smokeless tobacco: Not on file  Vaping Use   Vaping Use: Never used  Substance and Sexual Activity   Alcohol use: Not Currently   Drug use: Not Currently   Sexual activity: Not on file  Other Topics Concern   Not on file  Social History Narrative   Not on file   Social Determinants of Health   Financial Resource Strain: Not on file  Food Insecurity: Not on file  Transportation Needs: Not on file  Physical Activity: Not on file  Stress: Not on file  Social Connections: Not on file    Family History  Problem Relation Age of Onset   Hypertension Mother     Outpatient Encounter Medications as of 09/14/2021  Medication Sig   insulin aspart protamine - aspart (NOVOLOG MIX 70/30 FLEXPEN) (70-30) 100 UNIT/ML FlexPen Inject 20 Units into the skin 2 (two) times daily with a meal.   AMLODIPINE BESYLATE PO Take 5 mg by mouth daily.   atorvastatin (LIPITOR) 20 MG tablet Take 20 mg by mouth daily.   Blood Glucose Monitoring Suppl (ACCU-CHEK GUIDE ME) w/Device KIT 1 Piece by Does not apply route as directed.   carvedilol  (COREG) 25 MG tablet Take 25 mg by mouth 2 (two) times daily.   cetirizine (ZYRTEC) 10 MG tablet Take 10 mg by mouth daily.   glucose blood (ACCU-CHEK GUIDE) test strip Use as instructed   hydrALAZINE (APRESOLINE) 50 MG tablet Take 100 mg by mouth 2 (two) times daily.   JARDIANCE 10 MG TABS tablet Take 10 mg by mouth every morning.   losartan (COZAAR) 100 MG tablet Take 100 mg by mouth daily.   ondansetron (ZOFRAN) 4 MG tablet Take 4 mg by mouth every 8 (eight) hours as needed for nausea or vomiting.   Vitamin D, Ergocalciferol, (DRISDOL) 1.25 MG (50000 UNIT) CAPS capsule Take 50,000 Units by mouth every 14 (fourteen) days.   [DISCONTINUED] LEVEMIR FLEXTOUCH 100 UNIT/ML FlexPen Inject 20-25 Units into the skin at bedtime.   No facility-administered encounter medications on file as of 09/14/2021.    ALLERGIES: Allergies  Allergen Reactions   Methimazole Swelling    Pt states she experienced swelling of her face and tongue    VACCINATION STATUS:  There is  no immunization history on file for this patient.  Diabetes She presents for her follow-up diabetic visit. She has type 2 diabetes mellitus. Onset time: She was diagnosed at approximate age of 2 years. Her disease course has been worsening. There are no hypoglycemic associated symptoms. Pertinent negatives for hypoglycemia include no confusion, headaches, pallor or seizures. Pertinent negatives for diabetes include no chest pain, no fatigue, no polydipsia, no polyphagia and no polyuria. There are no hypoglycemic complications. Symptoms are worsening. Diabetic complications include nephropathy. Risk factors for coronary artery disease include diabetes mellitus, dyslipidemia, family history, obesity, hypertension, sedentary lifestyle and post-menopausal. Current diabetic treatment includes insulin injections. Her weight is decreasing steadily. She is following a generally unhealthy diet. When asked about meal planning, she reported none. She has  not had a previous visit with a dietitian. She never participates in exercise. Her home blood glucose trend is increasing steadily. Her lunch blood glucose range is generally 180-200 mg/dl. Her dinner blood glucose range is generally >200 mg/dl. Her bedtime blood glucose range is generally >200 mg/dl. Her overall blood glucose range is >200 mg/dl. (She presents with her CGM showing loss of control of glycemia, 37% Libre 1 hyperglycemia, 35% time range, 28% level 2 hypoglycemia.  No hypoglycemia.  Her point-of-care A1c is 8.8 at increasing from 7.2%.      ) An ACE inhibitor/angiotensin II receptor blocker is being taken. Eye exam is current.  Hyperlipidemia This is a chronic problem. The current episode started more than 1 year ago. The problem is controlled. Exacerbating diseases include chronic renal disease, diabetes and obesity. Pertinent negatives include no chest pain, myalgias or shortness of breath. Current antihyperlipidemic treatment includes statins. Risk factors for coronary artery disease include diabetes mellitus, hypertension, obesity, a sedentary lifestyle, post-menopausal and family history.  Hypertension This is a chronic problem. The current episode started more than 1 year ago. Pertinent negatives include no chest pain, headaches, palpitations or shortness of breath. Risk factors for coronary artery disease include dyslipidemia, diabetes mellitus, obesity, sedentary lifestyle, family history and post-menopausal state. Past treatments include angiotensin blockers, beta blockers, calcium channel blockers and direct vasodilators. Identifiable causes of hypertension include chronic renal disease.     Review of Systems  Constitutional:  Negative for chills, fatigue, fever and unexpected weight change.  HENT:  Negative for trouble swallowing and voice change.   Eyes:  Negative for visual disturbance.  Respiratory:  Negative for cough, shortness of breath and wheezing.   Cardiovascular:   Negative for chest pain, palpitations and leg swelling.  Gastrointestinal:  Negative for diarrhea, nausea and vomiting.  Endocrine: Negative for cold intolerance, heat intolerance, polydipsia, polyphagia and polyuria.  Musculoskeletal:  Negative for arthralgias and myalgias.  Skin:  Negative for color change, pallor, rash and wound.  Neurological:  Negative for seizures and headaches.  Psychiatric/Behavioral:  Negative for confusion and suicidal ideas.     Objective:       09/14/2021    2:07 PM 06/13/2021    1:28 PM 03/30/2021   11:19 AM  Vitals with BMI  Height 5' 3" 5' 3" 5' 3"  Weight 154 lbs 166 lbs 13 oz 158 lbs 3 oz  BMI 27.29 04.88 89.16  Systolic 945 038 882  Diastolic 82 78 75  Pulse 56 60 68    BP 124/82   Pulse (!) 56   Ht 5' 3" (1.6 m)   Wt 154 lb (69.9 kg)   BMI 27.28 kg/m   Wt Readings from Last 3 Encounters:  09/14/21 154 lb (69.9 kg)  06/13/21 166 lb 12.8 oz (75.7 kg)  03/30/21 158 lb 3.2 oz (71.8 kg)       CMP ( most recent) CMP     Component Value Date/Time   NA 139 09/06/2021 1240   NA 142 04/05/2021 0000   K 4.0 09/06/2021 1240   CL 109 09/06/2021 1240   CO2 23 09/06/2021 1240   GLUCOSE 138 (H) 09/06/2021 1240   BUN 37 (H) 09/06/2021 1240   BUN 36 (A) 04/05/2021 0000   CREATININE 2.23 (H) 09/06/2021 1240   CALCIUM 9.4 09/06/2021 1240   PROT 7.0 09/06/2021 1240   ALBUMIN 3.3 (L) 09/06/2021 1240   AST 19 09/06/2021 1240   ALT 16 09/06/2021 1240   ALKPHOS 87 09/06/2021 1240   BILITOT 1.1 09/06/2021 1240   GFRNONAA 23 (L) 09/06/2021 1240   GFRAA 32 06/27/2019 0000     Diabetic Labs (most recent): Lab Results  Component Value Date   HGBA1C 8.8 (A) 09/14/2021   HGBA1C 7.2 (A) 06/13/2021   HGBA1C 6.0 02/10/2021   MICROALBUR 150 05/20/2020     Lipid Panel ( most recent) Lipid Panel     Component Value Date/Time   CHOL 162 04/05/2021 0000   TRIG 51 04/05/2021 0000   HDL 57 04/05/2021 0000   CHOLHDL 3.7 01/18/2021 1027   VLDL 16  01/18/2021 1027   LDLCALC 94 04/05/2021 0000      Lab Results  Component Value Date   TSH 0.010 (L) 09/06/2021   TSH 0.38 (A) 04/05/2021   TSH 0.362 01/18/2021   TSH 0.05 (A) 11/26/2020   TSH 0.265 (L) 12/30/2019   TSH 0.062 (L) 10/09/2019   TSH 7.30 (A) 06/27/2019   FREET4 1.45 (H) 09/06/2021   FREET4 1.03 01/18/2021   FREET4 1.01 12/30/2019   FREET4 1.72 10/09/2019      Assessment & Plan:   1. Uncontrolled type 2 diabetes mellitus with hyperglycemia (HCC)  - Sevana Grandinetti has currently uncontrolled symptomatic type 2 DM since  71 years of age.  She presents with her CGM showing loss of control of glycemia, 37% Libre 1 hyperglycemia, 35% time range, 28% level 2 hypoglycemia.  No hypoglycemia.  Her point-of-care A1c is 8.8 at increasing from 7.2%.     Recent labs reviewed. - I had a long discussion with her about the progressive nature of diabetes and the pathology behind its complications. -her diabetes is complicated by CKD, obesity/sedentary life and she remains at a high risk for more acute and chronic complications which include CAD, CVA, CKD, retinopathy, and neuropathy. These are all discussed in detail with her.  - I have counseled her on diet  and weight management  by adopting a carbohydrate restricted/protein rich diet. Patient is encouraged to switch to  unprocessed or minimally processed     complex starch and increased protein intake (animal or plant source), fruits, and vegetables. -  she is advised to stick to a routine mealtimes to eat 3 meals  a day and avoid unnecessary snacks ( to snack only to correct hypoglycemia).   - she acknowledges that there is a room for improvement in her food and drink choices. - Suggestion is made for her to avoid simple carbohydrates  from her diet including Cakes, Sweet Desserts, Ice Cream, Soda (diet and regular), Sweet Tea, Candies, Chips, Cookies, Store Bought Juices, Alcohol in Excess of  1-2 drinks a day, Artificial  Sweeteners,  Coffee Creamer, and "Sugar-free" Products, Lemonade. This  will help patient to have more stable blood glucose profile and potentially avoid unintended weight gain.   - she will be scheduled with Jearld Fenton, RDN, CDE for diabetes education.  - I have approached her with the following individualized plan to manage  her diabetes and patient agrees:   -In light of her presentation with loss of control of glycemia, she will need multiple daily injections of insulin to achieve control of diabetes to target.  She will be considered for premixed insulin to use twice a day.  I discussed and prescribed NovoLog 70/30 20 units with breakfast and 20 units with supper for [place] blood glucose readings above 90 mg per DL.    She is advised to discontinue Levemir.  She will continue to benefit from Jardiance, 10 mg p.o. daily at breakfast.   She is benefiting from her CGM, advised to continue to wear her CGM at all times. -She is encouraged to call clinic for hypoglycemia below 70 or hyperglycemia above 200 mg per DL x 3.   - Specific targets for  A1c;  LDL, HDL,  and Triglycerides were discussed with the patient.  2) Blood Pressure /Hypertension:  -Her blood pressure is controlled to target.  - she is advised to continue her current medications including losartan 25 mg p.o. daily, carvedilol 25 mg p.o. twice daily, hydralazine 50 mg p.o. twice daily.  -She was given potassium supplement which has helped correct her potassium to 4 mmol/L.  She is advised to discontinue potassium.      3) Lipids/Hyperlipidemia:   Review of her recent lipid panel showed uncontrolled LDL at 94.     she  is advised to continue atorvastatin 20 mg p.o. daily at bedtime.  She will be considered for fasting lipid panel at subsequent visits.    -  Side effects and precautions discussed with her.  4)  Weight/Diet:  Body mass index is 27.28 kg/m.  -   clearly complicating her diabetes care.   she is  a candidate  for weight loss. I discussed with her the fact that loss of 5 - 10% of her  current body weight will have the most impact on her diabetes management.  Exercise, and detailed carbohydrates information provided  -  detailed on discharge instructions.  5) toxic multinodular goiter: -Her medical records were reviewed.  She underwent bilateral fine-needle aspiration of nodules on both lobes of the thyroid with benign findings.  She was treated adequately with what appears to be methimazole until 5 months ago.  Her recent labs are consistent with euthyroid state.  And her 24-hour uptake was 25.3% on the background of enlarged multinodular thyroid lobe with multiple small hard nodules seen within both lobes consistent with hyperfunctional adenomas.   She will not need ablative treatment.  However she will be considered for thyroid ultrasound after her next visit.   -Her thyroid function tests are consistent with euthyroid presentation, she will be considered for repeat thyroid function test before next visit.   6) Chronic Care/Health Maintenance:  -she  is on ACEI/ARB and Statin medications and  is encouraged to initiate and continue to follow up with Ophthalmology, Dentist,  Podiatrist at least yearly or according to recommendations, and advised to   stay away from smoking. I have recommended yearly flu vaccine and pneumonia vaccine at least every 5 years; moderate intensity exercise for up to 150 minutes weekly; and  sleep for at least 7 hours a day.  She did have normal  screening point-of-care ABI in December 2022.   This study will be repeated in December 2026, or sooner if needed. - she is  advised to maintain close follow up with Thornton Papas, PA-C for primary care needs, as well as her other providers for optimal and coordinated care.   I spent 44 minutes in the care of the patient today including review of labs from [facility], Lipids, Thyroid Function, Hematology (current and previous including  abstractions from other facilities); face-to-face time discussing  her blood glucose readings/logs, discussing hypoglycemia and hyperglycemia episodes and symptoms, medications doses, her options of short and long term treatment based on the latest standards of care / guidelines;  discussion about incorporating lifestyle medicine;  and documenting the encounter.    Please refer to Patient Instructions for Blood Glucose Monitoring and Insulin/Medications Dosing Guide"  in media tab for additional information. Please  also refer to " Patient Self Inventory" in the Media  tab for reviewed elements of pertinent patient history.  Hayes Ludwig participated in the discussions, expressed understanding, and voiced agreement with the above plans.  All questions were answered to her satisfaction. she is encouraged to contact clinic should she have any questions or concerns prior to her return visit.    Follow up plan: - Return in about 3 months (around 12/15/2021) for Bring Meter/CGM Device/Logs- A1c in Office.  Glade Lloyd, MD [facility] [address] [place], [place] [address] Phone: [phone]  Fax: [phone]    09/14/2021, 6:09 PM  This note was partially dictated with voice recognition software. Similar sounding words can be transcribed inadequately or may not  be corrected upon review.

## 2021-09-14 NOTE — Patient Instructions (Signed)

## 2021-09-15 ENCOUNTER — Ambulatory Visit: Payer: Medicare Other | Admitting: "Endocrinology

## 2021-09-27 ENCOUNTER — Encounter (HOSPITAL_COMMUNITY)
Admission: RE | Admit: 2021-09-27 | Discharge: 2021-09-27 | Disposition: A | Discharge status: Home / Self Care | Payer: Medicare Other | Source: Ambulatory Visit | Attending: "Endocrinology | Admitting: "Endocrinology

## 2021-09-27 DIAGNOSIS — E0520 Thyrotoxicosis with toxic multinodular goiter without thyrotoxic crisis or storm: Secondary | ICD-10-CM | POA: Diagnosis not present

## 2021-09-27 DIAGNOSIS — E0590 Thyrotoxicosis, unspecified without thyrotoxic crisis or storm: Secondary | ICD-10-CM | POA: Diagnosis present

## 2021-09-27 MED ORDER — SODIUM IODIDE I-123 7.4 MBQ CAPS
300.0000 | ORAL_CAPSULE | Freq: Once | ORAL | Status: AC
  Administered 2021-09-27: 300 via ORAL

## 2021-09-28 ENCOUNTER — Encounter (HOSPITAL_COMMUNITY)
Admission: RE | Admit: 2021-09-28 | Discharge: 2021-09-28 | Disposition: A | Discharge status: Home / Self Care | Payer: Medicare Other | Source: Ambulatory Visit | Attending: "Endocrinology | Admitting: "Endocrinology

## 2021-10-20 ENCOUNTER — Encounter: Payer: Self-pay | Admitting: "Endocrinology

## 2021-11-01 ENCOUNTER — Telehealth: Payer: Self-pay | Admitting: "Endocrinology

## 2021-11-01 DIAGNOSIS — E1165 Type 2 diabetes mellitus with hyperglycemia: Secondary | ICD-10-CM

## 2021-11-01 DIAGNOSIS — E0590 Thyrotoxicosis, unspecified without thyrotoxic crisis or storm: Secondary | ICD-10-CM

## 2021-11-01 NOTE — Telephone Encounter (Signed)
New message   Patient calling for test results from 09/27/21  22 & 09/28/21.

## 2021-11-02 ENCOUNTER — Other Ambulatory Visit: Payer: Self-pay | Admitting: "Endocrinology

## 2021-11-02 DIAGNOSIS — E0520 Thyrotoxicosis with toxic multinodular goiter without thyrotoxic crisis or storm: Secondary | ICD-10-CM

## 2021-11-02 NOTE — Telephone Encounter (Signed)
Discussed with pt, understanding voiced. Pt scheduled for October 11 at 2p.m.

## 2021-11-04 ENCOUNTER — Other Ambulatory Visit (HOSPITAL_COMMUNITY)
Admission: RE | Admit: 2021-11-04 | Discharge: 2021-11-04 | Disposition: A | Discharge status: Home / Self Care | Payer: Medicare Other | Source: Ambulatory Visit | Attending: "Endocrinology | Admitting: "Endocrinology

## 2021-11-04 DIAGNOSIS — E0520 Thyrotoxicosis with toxic multinodular goiter without thyrotoxic crisis or storm: Secondary | ICD-10-CM | POA: Diagnosis present

## 2021-11-04 LAB — TSH: TSH: 0.043 u[IU]/mL — ABNORMAL LOW (ref 0.350–4.500)

## 2021-11-04 LAB — T4, FREE: Free T4: 1.13 ng/dL — ABNORMAL HIGH (ref 0.61–1.12)

## 2021-11-05 LAB — T3, FREE: T3, Free: 2.7 pg/mL (ref 2.0–4.4)

## 2021-11-16 ENCOUNTER — Encounter: Payer: Self-pay | Admitting: "Endocrinology

## 2021-11-16 ENCOUNTER — Ambulatory Visit (INDEPENDENT_AMBULATORY_CARE_PROVIDER_SITE_OTHER): Payer: Medicare Other | Admitting: "Endocrinology

## 2021-11-16 VITALS — BP 136/78 | HR 64 | Ht 63.0 in | Wt 158.2 lb

## 2021-11-16 DIAGNOSIS — I10 Essential (primary) hypertension: Secondary | ICD-10-CM

## 2021-11-16 DIAGNOSIS — E1165 Type 2 diabetes mellitus with hyperglycemia: Secondary | ICD-10-CM

## 2021-11-16 DIAGNOSIS — E0520 Thyrotoxicosis with toxic multinodular goiter without thyrotoxic crisis or storm: Secondary | ICD-10-CM

## 2021-11-16 DIAGNOSIS — E0590 Thyrotoxicosis, unspecified without thyrotoxic crisis or storm: Secondary | ICD-10-CM

## 2021-11-16 DIAGNOSIS — E782 Mixed hyperlipidemia: Secondary | ICD-10-CM | POA: Diagnosis not present

## 2021-11-16 NOTE — Progress Notes (Signed)
11/16/2021, 10:12 PM   Endocrinology follow-up note  Subjective:    Patient ID: Gloria Webb, female    DOB: 1951-01-28.  Gloria Webb is being seen in follow-up after she was seen in consultation for management of type 2 diabetes, toxic multinodular goiter, hyperlipidemia, hypertension.    PCP: Thornton Papas, PA-C.   Past Medical History:  Diagnosis Date   Diabetes mellitus, type II ([place])    Hypertension    Thyroid disease     Past Surgical History:  Procedure Laterality Date   broken ankle      Social History   Socioeconomic History   Marital status: Divorced    Spouse name: Not on file   Number of children: Not on file   Years of education: Not on file   Highest education level: Not on file  Occupational History   Not on file  Tobacco Use   Smoking status: Former    Types: Cigarettes   Smokeless tobacco: Not on file  Vaping Use   Vaping Use: Never used  Substance and Sexual Activity   Alcohol use: Not Currently   Drug use: Not Currently   Sexual activity: Not on file  Other Topics Concern   Not on file  Social History Narrative   Not on file   Social Determinants of Health   Financial Resource Strain: Not on file  Food Insecurity: Not on file  Transportation Needs: Not on file  Physical Activity: Not on file  Stress: Not on file  Social Connections: Not on file    Family History  Problem Relation Age of Onset   Hypertension Mother     Outpatient Encounter Medications as of 11/16/2021  Medication Sig   AMLODIPINE BESYLATE PO Take 5 mg by mouth daily.   atorvastatin (LIPITOR) 20 MG tablet Take 20 mg by mouth daily.   Blood Glucose Monitoring Suppl (ACCU-CHEK GUIDE ME) w/Device KIT 1 Piece by Does not apply route as directed.   carvedilol (COREG) 25 MG tablet Take 25 mg by mouth 2 (two) times daily.   cetirizine (ZYRTEC) 10 MG tablet Take 10 mg by mouth daily.    glucose blood (ACCU-CHEK GUIDE) test strip Use as instructed   hydrALAZINE (APRESOLINE) 50 MG tablet Take 100 mg by mouth 2 (two) times daily.   insulin aspart protamine - aspart (NOVOLOG MIX 70/30 FLEXPEN) (70-30) 100 UNIT/ML FlexPen Inject 20 Units into the skin 2 (two) times daily with a meal.   JARDIANCE 10 MG TABS tablet Take 10 mg by mouth every morning.   losartan (COZAAR) 100 MG tablet Take 100 mg by mouth daily.   ondansetron (ZOFRAN) 4 MG tablet Take 4 mg by mouth every 8 (eight) hours as needed for nausea or vomiting.   Vitamin D, Ergocalciferol, (DRISDOL) 1.25 MG (50000 UNIT) CAPS capsule Take 50,000 Units by mouth every 14 (fourteen) days.   No facility-administered encounter medications on file as of 11/16/2021.    ALLERGIES: Allergies  Allergen Reactions   Methimazole Swelling    Pt states she experienced swelling of her face and tongue    VACCINATION STATUS:  There is no immunization history on file for this patient.  Diabetes She presents for  her follow-up diabetic visit. She has type 2 diabetes mellitus. Onset time: She was diagnosed at approximate age of 21 years. Her disease course has been improving. There are no hypoglycemic associated symptoms. Pertinent negatives for hypoglycemia include no confusion, headaches, pallor or seizures. Pertinent negatives for diabetes include no chest pain, no fatigue, no polydipsia, no polyphagia and no polyuria. There are no hypoglycemic complications. Symptoms are improving. Diabetic complications include nephropathy. Risk factors for coronary artery disease include diabetes mellitus, dyslipidemia, family history, obesity, hypertension, sedentary lifestyle and post-menopausal. Current diabetic treatment includes insulin injections. Her weight is fluctuating minimally. She is following a generally unhealthy diet. When asked about meal planning, she reported none. She has not had a previous visit with a dietitian. She never participates in  exercise. Her home blood glucose trend is decreasing steadily. Her breakfast blood glucose range is generally 140-180 mg/dl. Her lunch blood glucose range is generally 140-180 mg/dl. Her dinner blood glucose range is generally 140-180 mg/dl. Her bedtime blood glucose range is generally 140-180 mg/dl. Her overall blood glucose range is 140-180 mg/dl. (She presents with her CGM showing improved glycemic profile.  Her libre AGP shows 75% time range, 20% level 1 hyperglycemia, 3% level 2 hyperglycemia.  She has 2% level of 1 hypoglycemia.  Her average blood glucose is 155 mg per DL.   During her last visit, her point-of-care A1c was 8.8 at increasing from 7.2%.      ) An ACE inhibitor/angiotensin II receptor blocker is being taken. Eye exam is current.  Hyperlipidemia This is a chronic problem. The current episode started more than 1 year ago. The problem is controlled. Exacerbating diseases include chronic renal disease, diabetes and obesity. Pertinent negatives include no chest pain, myalgias or shortness of breath. Current antihyperlipidemic treatment includes statins. Risk factors for coronary artery disease include diabetes mellitus, hypertension, obesity, a sedentary lifestyle, post-menopausal and family history.  Hypertension This is a chronic problem. The current episode started more than 1 year ago. Pertinent negatives include no chest pain, headaches, palpitations or shortness of breath. Risk factors for coronary artery disease include dyslipidemia, diabetes mellitus, obesity, sedentary lifestyle, family history and post-menopausal state. Past treatments include angiotensin blockers, beta blockers, calcium channel blockers and direct vasodilators. Identifiable causes of hypertension include chronic renal disease.     Review of Systems  Constitutional:  Negative for chills, fatigue, fever and unexpected weight change.  HENT:  Negative for trouble swallowing and voice change.   Eyes:  Negative  for visual disturbance.  Respiratory:  Negative for cough, shortness of breath and wheezing.   Cardiovascular:  Negative for chest pain, palpitations and leg swelling.  Gastrointestinal:  Negative for diarrhea, nausea and vomiting.  Endocrine: Negative for cold intolerance, heat intolerance, polydipsia, polyphagia and polyuria.  Musculoskeletal:  Negative for arthralgias and myalgias.  Skin:  Negative for color change, pallor, rash and wound.  Neurological:  Negative for seizures and headaches.  Psychiatric/Behavioral:  Negative for confusion and suicidal ideas.     Objective:       11/16/2021    2:16 PM 09/14/2021    2:07 PM 06/13/2021    1:28 PM  Vitals with BMI  Height '5\' 3"'  '5\' 3"'  '5\' 3"'   Weight 158 lbs 3 oz 154 lbs 166 lbs 13 oz  BMI 28.03 09.32 35.57  Systolic 322 025 427  Diastolic 78 82 78  Pulse 64 56 60    BP 136/78   Pulse 64   Ht '5\' 3"'  (1.6 m)  Wt 158 lb 3.2 oz (71.8 kg)   BMI 28.02 kg/m   Wt Readings from Last 3 Encounters:  11/16/21 158 lb 3.2 oz (71.8 kg)  09/14/21 154 lb (69.9 kg)  06/13/21 166 lb 12.8 oz (75.7 kg)       CMP ( most recent) CMP     Component Value Date/Time   NA 139 09/06/2021 1240   NA 142 04/05/2021 0000   K 4.0 09/06/2021 1240   CL 109 09/06/2021 1240   CO2 23 09/06/2021 1240   GLUCOSE 138 (H) 09/06/2021 1240   BUN 37 (H) 09/06/2021 1240   BUN 36 (A) 04/05/2021 0000   CREATININE 2.23 (H) 09/06/2021 1240   CALCIUM 9.4 09/06/2021 1240   PROT 7.0 09/06/2021 1240   ALBUMIN 3.3 (L) 09/06/2021 1240   AST 19 09/06/2021 1240   ALT 16 09/06/2021 1240   ALKPHOS 87 09/06/2021 1240   BILITOT 1.1 09/06/2021 1240   GFRNONAA 23 (L) 09/06/2021 1240   GFRAA 32 06/27/2019 0000     Diabetic Labs (most recent): Lab Results  Component Value Date   HGBA1C 8.8 (A) 09/14/2021   HGBA1C 7.2 (A) 06/13/2021   HGBA1C 6.0 02/10/2021   MICROALBUR 150 05/20/2020     Lipid Panel ( most recent) Lipid Panel     Component Value Date/Time   CHOL  162 04/05/2021 0000   TRIG 51 04/05/2021 0000   HDL 57 04/05/2021 0000   CHOLHDL 3.7 01/18/2021 1027   VLDL 16 01/18/2021 1027   LDLCALC 94 04/05/2021 0000      Lab Results  Component Value Date   TSH 0.043 (L) 11/04/2021   TSH 0.010 (L) 09/06/2021   TSH 0.38 (A) 04/05/2021   TSH 0.362 01/18/2021   TSH 0.05 (A) 11/26/2020   TSH 0.265 (L) 12/30/2019   TSH 0.062 (L) 10/09/2019   TSH 7.30 (A) 06/27/2019   FREET4 1.13 (H) 11/04/2021   FREET4 1.45 (H) 09/06/2021   FREET4 1.03 01/18/2021   FREET4 1.01 12/30/2019   FREET4 1.72 10/09/2019      Assessment & Plan:   1. Uncontrolled type 2 diabetes mellitus with hyperglycemia (HCC)  - Gloria Webb has currently uncontrolled symptomatic type 2 DM since  71 years of age.  She presents with her CGM showing improved glycemic profile.  Her libre AGP shows 75% time range, 20% level 1 hyperglycemia, 3% level 2 hyperglycemia.  She has 2% level of 1 hypoglycemia.  Her average blood glucose is 155 mg per DL.   During her last visit, her point-of-care A1c was 8.8 at increasing from 7.2%.     Recent labs reviewed. - I had a long discussion with her about the progressive nature of diabetes and the pathology behind its complications. -her diabetes is complicated by CKD, obesity/sedentary life and she remains at a high risk for more acute and chronic complications which include CAD, CVA, CKD, retinopathy, and neuropathy. These are all discussed in detail with her.  - I have counseled her on diet  and weight management  by adopting a carbohydrate restricted/protein rich diet. Patient is encouraged to switch to  unprocessed or minimally processed     complex starch and increased protein intake (animal or plant source), fruits, and vegetables. -  she is advised to stick to a routine mealtimes to eat 3 meals  a day and avoid unnecessary snacks ( to snack only to correct hypoglycemia).   - she acknowledges that there is a room for improvement in her  food and drink choices. - Suggestion is made for her to avoid simple carbohydrates  from her diet including Cakes, Sweet Desserts, Ice Cream, Soda (diet and regular), Sweet Tea, Candies, Chips, Cookies, Store Bought Juices, Alcohol in Excess of  1-2 drinks a day, Artificial Sweeteners,  Coffee Creamer, and "Sugar-free" Products, Lemonade. This will help patient to have more stable blood glucose profile and potentially avoid unintended weight gain.  - she will be scheduled with Gloria Webb, Gloria Webb, Gloria Webb for diabetes education.  - I have approached her with the following individualized plan to manage  her diabetes and patient agrees:   -She presents with improved glycemic profile.  She is advised to continue NovoLog 70/30 20 units with breakfast and 20 units with supper  for [place] blood glucose readings above 90 mg per DL.     She will continue to benefit from Jardiance, 10 mg p.o. daily at breakfast.   She is benefiting from her CGM, advised to continue to wear her CGM at all times. -She is encouraged to call clinic for hypoglycemia below 70 or hyperglycemia above 200 mg per DL x 3.   - Specific targets for  A1c;  LDL, HDL,  and Triglycerides were discussed with the patient.  2) Blood Pressure /Hypertension:  -Her blood pressure is controlled to target. she is advised to continue her current medications including losartan 25 mg p.o. daily, carvedilol 25 mg p.o. twice daily, hydralazine 50 mg p.o. twice daily.  -She was given potassium supplement which has helped correct her potassium to 4 mmol/L.  She is advised to discontinue potassium.      3) Lipids/Hyperlipidemia:   Review of her recent lipid panel showed uncontrolled LDL at 94.     she  is advised to continue atorvastatin 20 mg p.o. daily at bedtime.   She will be considered for fasting lipid panel at subsequent visits.    -  Side effects and precautions discussed with her.  4)  Weight/Diet:  Body mass index is 28.02 kg/m.  -      she is  a candidate for weight loss. I discussed with her the fact that loss of 5 - 10% of her  current body weight will have the most impact on her diabetes management.  Exercise, and detailed carbohydrates information provided  -  detailed on discharge instructions.  5) toxic multinodular goiter: -Her medical records were reviewed.  She underwent bilateral fine-needle aspiration of nodules on both lobes of the thyroid with benign findings.  She was treated with methimazole with partial response.  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  She has compressive symptoms, however wishes to avoid surgery.  Options were discussed with her including radioactive iodine thyroid ablation.    She agrees with this option.  I have ordered RAI to be done in the next several days in [facility].   She is made aware of subsequent hypothyroidism which will need thyroid hormone replacement for life.   6) Chronic Care/Health Maintenance:  -she  is on ACEI/ARB and Statin medications and  is encouraged to initiate and continue to follow up with Ophthalmology, Dentist,  Podiatrist at least yearly or according to recommendations, and advised to   stay away from smoking. I have recommended yearly flu vaccine and pneumonia vaccine at least every 5 years; moderate intensity exercise for up to 150 minutes weekly; and  sleep for at least 7 hours a day.  She did have normal screening point-of-care  ABI in December 2022.   This study will be repeated in December 2026, or sooner if needed. - she is  advised to maintain close follow up with Thornton Papas, PA-C for primary care needs, as well as her other providers for optimal and coordinated care.   I spent 41 minutes in the care of the patient today including review of labs from [facility], Lipids, Thyroid Function, Hematology (current and previous including abstractions from other facilities); face-to-face time discussing  her  blood glucose readings/logs, discussing hypoglycemia and hyperglycemia episodes and symptoms, medications doses, her options of short and long term treatment based on the latest standards of care / guidelines;  discussion about incorporating lifestyle medicine;  and documenting the encounter. Risk reduction counseling performed per USPSTF guidelines to reduce  obesity and cardiovascular risk factors.     Please refer to Patient Instructions for Blood Glucose Monitoring and Insulin/Medications Dosing Guide"  in media tab for additional information. Please  also refer to " Patient Self Inventory" in the Media  tab for reviewed elements of pertinent patient history.  Gloria Webb participated in the discussions, expressed understanding, and voiced agreement with the above plans.  All questions were answered to her satisfaction. she is encouraged to contact clinic should she have any questions or concerns prior to her return visit.   Follow up plan: - Return in about 9 weeks (around 01/18/2022) for F/U with Labs after I131 Therapy, A1c -NV.  Glade Lloyd, MD [facility] [address] [place], [place] [address] Phone: [phone]  Fax: [phone]    11/16/2021, 10:12 PM  This note was partially dictated with voice recognition software. Similar sounding words can be transcribed inadequately or may not  be corrected upon review.

## 2021-11-17 ENCOUNTER — Other Ambulatory Visit: Payer: Self-pay | Admitting: "Endocrinology

## 2021-11-17 DIAGNOSIS — E0520 Thyrotoxicosis with toxic multinodular goiter without thyrotoxic crisis or storm: Secondary | ICD-10-CM

## 2021-11-17 NOTE — Written Directive (Addendum)
MOLECULAR IMAGING AND THERAPEUTICS WRITTEN DIRECTIVE   PATIENT NAME: Gloria Webb  PT DOB:   08/20/1950                                              MRN: [id number]  ---------------------------------------------------------------------------------------------------------------------   I-131 WHOLE THYROID THERAPY (NON-CANCER)    RADIOPHARMACEUTICAL:   Iodine-131 Capsule    PRESCRIBED DOSE FOR ADMINISTRATION: 40 mCi   (forty milliCuries)   ROUTE OFADMINISTRATION: PO   DIAGNOSIS:  Toxic MNG    REFERRING PHYSICIAN: Dr. Dorris Fetch   TSH:    Lab Results  Component Value Date   TSH 0.043 (L) 11/04/2021   TSH 0.010 (L) 09/06/2021   TSH 0.38 (A) 04/05/2021     PRIOR I-131 THERAPY (Date and Dose): N/A   PRIOR RADIOLOGY EXAMS (Results and Date): NM THYROID MULT UPTAKE W/IMAGING  Result Date: 09/28/2021 CLINICAL DATA:  Hyperthyroidism, TSH 0.01 EXAM: THYROID SCAN AND UPTAKE - 4 AND 24 HOURS TECHNIQUE: Following oral administration of I-123 capsule, anterior planar imaging was acquired at 24 hours. Thyroid uptake was calculated with a thyroid probe at 4-6 hours and 24 hours. RADIOPHARMACEUTICALS:  Three hundred uCi I-123 sodium iodide p.o. COMPARISON:  11/05/2019 FINDINGS: Multiple warm nodules in BILATERAL enlarged BILATERAL enlarged thyroid lobes consistent with multinodular goiter. Suppression of uptake in remaining thyroid tissue. Overall pattern of tracer distribution is similar to that seen on the previous exam. 4 hour I-123 uptake = 8.5% (normal 5-20%) 24 hour I-123 uptake = 27.4% (normal 10-30%) IMPRESSION: Multinodular thyroid gland. Multiple warm nodules with suppression of uptake in remaining thyroid tissue consistent hyperthyroidism and toxic nodular goiter. Electronically Signed   By: Lavonia Dana M.D.   On: 09/28/2021 10:33   NM THYROID MULT UPTAKE W/IMAGING  Result Date: 11/05/2019 CLINICAL DATA:  Neck swelling tenderness, diarrhea, perspiration, fatigue, weakness, dry  skin drowsiness, toxic multinodular goiter and hyperthyroidism; TSH 0.062 EXAM: THYROID SCAN AND UPTAKE - 4 AND 24 HOURS TECHNIQUE: Following the per oral administration of I-131 sodium iodide, the patient returned at 4 and 24 hours and uptake measurements were acquired with the uptake probe centered on the neck. Thyroid imaging was performed following the intravenous administration of Tc-72m Pertechnetate. RADIOPHARMACEUTICALS:  10 mCi Technetium-61m pertechnetate IV and 18 microCuries I-131 sodium iodide orally COMPARISON:  None FINDINGS: Enlarged thyroid lobes. Multiple foci of increased tracer uptake in each thyroid lobe consistent with small hyperfunctional nodules. Additional scattered cold nodules and suppressed parenchymal uptake of tracer in both lobes. Findings are consistent with a multinodular thyroid gland 4 hour I-131 uptake = 7.5% (normal 5-20%) 24 hour I-131 uptake = 25.3% (normal 10-30%) IMPRESSION: Normal 4 hour and 24 hour radio iodine uptakes. Enlarged multinodular thyroid lobe gland with multiple small hot nodule seen within both lobes consistent with hyperfunctional adenomas. Additional cold nodules and suppressed uptake in remaining thyroid tissue. Recommend thyroid ultrasound to exclude dominant cold mass in the thyroid gland. Electronically Signed   By: Lavonia Dana M.D.   On: 11/05/2019 13:00      ADDITIONAL PHYSICIAN COMMENTS/NOTES   AUTHORIZED USER SIGNATURE & TIME STAMP:

## 2021-12-01 ENCOUNTER — Encounter (HOSPITAL_COMMUNITY): Payer: Self-pay

## 2021-12-01 ENCOUNTER — Encounter (HOSPITAL_COMMUNITY)
Admission: RE | Admit: 2021-12-01 | Discharge: 2021-12-01 | Disposition: A | Discharge status: Home / Self Care | Payer: Medicare Other | Source: Ambulatory Visit | Attending: "Endocrinology | Admitting: "Endocrinology

## 2021-12-01 DIAGNOSIS — E0520 Thyrotoxicosis with toxic multinodular goiter without thyrotoxic crisis or storm: Secondary | ICD-10-CM | POA: Diagnosis present

## 2021-12-01 DIAGNOSIS — E0590 Thyrotoxicosis, unspecified without thyrotoxic crisis or storm: Secondary | ICD-10-CM | POA: Insufficient documentation

## 2021-12-01 MED ORDER — SODIUM IODIDE I 131 CAPSULE
40.0000 | Freq: Once | INTRAVENOUS | Status: AC | PRN
  Administered 2021-12-01: 40.3 via ORAL

## 2021-12-15 ENCOUNTER — Ambulatory Visit: Payer: PRIVATE HEALTH INSURANCE | Admitting: "Endocrinology

## 2022-01-09 ENCOUNTER — Other Ambulatory Visit (HOSPITAL_COMMUNITY)
Admission: RE | Admit: 2022-01-09 | Discharge: 2022-01-09 | Disposition: A | Discharge status: Home / Self Care | Payer: Medicare Other | Source: Ambulatory Visit | Attending: "Endocrinology | Admitting: "Endocrinology

## 2022-01-09 DIAGNOSIS — E1165 Type 2 diabetes mellitus with hyperglycemia: Secondary | ICD-10-CM | POA: Insufficient documentation

## 2022-01-09 LAB — COMPREHENSIVE METABOLIC PANEL
ALT: 13 U/L (ref 0–44)
AST: 16 U/L (ref 15–41)
Albumin: 3.3 g/dL — ABNORMAL LOW (ref 3.5–5.0)
Alkaline Phosphatase: 80 U/L (ref 38–126)
Anion gap: 9 (ref 5–15)
BUN: 40 mg/dL — ABNORMAL HIGH (ref 8–23)
CO2: 22 mmol/L (ref 22–32)
Calcium: 9.1 mg/dL (ref 8.9–10.3)
Chloride: 106 mmol/L (ref 98–111)
Creatinine, Ser: 2.52 mg/dL — ABNORMAL HIGH (ref 0.44–1.00)
GFR, Estimated: 20 mL/min — ABNORMAL LOW (ref >60)
Glucose, Bld: 206 mg/dL — ABNORMAL HIGH (ref 70–99)
Potassium: 4 mmol/L (ref 3.5–5.1)
Sodium: 137 mmol/L (ref 135–145)
Total Bilirubin: 0.7 mg/dL (ref 0.3–1.2)
Total Protein: 7.2 g/dL (ref 6.5–8.1)

## 2022-01-09 LAB — LIPID PANEL
Cholesterol: 140 mg/dL (ref 0–200)
HDL: 48 mg/dL (ref >40)
LDL Cholesterol: 79 mg/dL (ref 0–99)
Total CHOL/HDL Ratio: 2.9 RATIO
Triglycerides: 64 mg/dL (ref <150)
VLDL: 13 mg/dL (ref 0–40)

## 2022-01-09 LAB — T4, FREE: Free T4: 0.96 ng/dL (ref 0.61–1.12)

## 2022-01-09 LAB — TSH: TSH: 1.755 u[IU]/mL (ref 0.350–4.500)

## 2022-01-10 LAB — T3, FREE: T3, Free: 1.8 pg/mL — ABNORMAL LOW (ref 2.0–4.4)

## 2022-01-18 ENCOUNTER — Encounter: Payer: Self-pay | Admitting: "Endocrinology

## 2022-01-18 ENCOUNTER — Ambulatory Visit (INDEPENDENT_AMBULATORY_CARE_PROVIDER_SITE_OTHER): Payer: Medicare Other | Admitting: "Endocrinology

## 2022-01-18 VITALS — BP 132/74 | HR 56 | Ht 63.0 in | Wt 159.0 lb

## 2022-01-18 DIAGNOSIS — E0520 Thyrotoxicosis with toxic multinodular goiter without thyrotoxic crisis or storm: Secondary | ICD-10-CM

## 2022-01-18 DIAGNOSIS — E782 Mixed hyperlipidemia: Secondary | ICD-10-CM | POA: Diagnosis not present

## 2022-01-18 DIAGNOSIS — I10 Essential (primary) hypertension: Secondary | ICD-10-CM

## 2022-01-18 DIAGNOSIS — E1165 Type 2 diabetes mellitus with hyperglycemia: Secondary | ICD-10-CM | POA: Diagnosis not present

## 2022-01-18 LAB — POCT GLYCOSYLATED HEMOGLOBIN (HGB A1C): HbA1c, POC (controlled diabetic range): 7.1 % — AB (ref 0.0–7.0)

## 2022-01-18 MED ORDER — NOVOLOG MIX 70/30 FLEXPEN (70-30) 100 UNIT/ML ~~LOC~~ SUPN: 20.0000 [IU] | PEN_INJECTOR | Freq: Two times a day (BID) | SUBCUTANEOUS | 2 refills | Status: DC

## 2022-01-18 NOTE — Patient Instructions (Signed)
                                     Advice for Weight Management  -For most of us the best way to lose weight is by diet management. Generally speaking, diet management means consuming less calories intentionally which over time brings about progressive weight loss.  This can be achieved more effectively by avoiding ultra processed carbohydrates, processed meats, unhealthy fats.    It is critically important to know your numbers: how much calorie you are consuming and how much calorie you need. More importantly, our carbohydrates sources should be unprocessed naturally occurring  complex starch food items.  It is always important to balance nutrition also by  appropriate intake of proteins (mainly plant-based), healthy fats/oils, plenty of fruits and vegetables.   -The American College of Lifestyle Medicine (ACL M) recommends nutrition derived mostly from Whole Food, Plant Predominant Sources example an apple instead of applesauce or apple pie. Eat Plenty of vegetables, Mushrooms, fruits, Legumes, Whole Grains, Nuts, seeds in lieu of processed meats, processed snacks/pastries red meat, poultry, eggs.  Use only water or unsweetened tea for hydration.  The College also recommends the need to stay away from risky substances including alcohol, smoking; obtaining 7-9 hours of restorative sleep, at least 150 minutes of moderate intensity exercise weekly, importance of healthy social connections, and being mindful of stress and seek help when it is overwhelming.    -Sticking to a routine mealtime to eat 3 meals a day and avoiding unnecessary snacks is shown to have a big role in weight control. Under normal circumstances, the only time we burn stored energy is when we are hungry, so allow  some hunger to take place- hunger means no food between appropriate meal times, only water.  It is not advisable to starve.   -It is better to avoid simple carbohydrates including:  Cakes, Sweet Desserts, Ice Cream, Soda (diet and regular), Sweet Tea, Candies, Chips, Cookies, Store Bought Juices, Alcohol in Excess of  1-2 drinks a day, Lemonade,  Artificial Sweeteners, Doughnuts, Coffee Creamers, "Sugar-free" Products, etc, etc.  This is not a complete list.....    -Consulting with certified diabetes educators is proven to provide you with the most accurate and current information on diet.  Also, you may be  interested in discussing diet options/exchanges , we can schedule a visit with Gloria Webb, RDN, CDE for individualized nutrition education.  -Exercise: If you are able: 30 -60 minutes a day ,4 days a week, or 150 minutes of moderate intensity exercise weekly.    The longer the better if tolerated.  Combine stretch, strength, and aerobic activities.  If you were told in the past that you have high risk for cardiovascular diseases, or if you are currently symptomatic, you may seek evaluation by your heart doctor prior to initiating moderate to intense exercise programs.                                  Additional Care Considerations for Diabetes/Prediabetes   -Diabetes  is a chronic disease.  The most important care consideration is regular follow-up with your diabetes care provider with the goal being avoiding or delaying its complications and to take advantage of advances in medications and technology.  If appropriate actions are taken early enough, type 2 diabetes can even be   reversed.  Seek information from the right source.  - Whole Food, Plant Predominant Nutrition is highly recommended: Eat Plenty of vegetables, Mushrooms, fruits, Legumes, Whole Grains, Nuts, seeds in lieu of processed meats, processed snacks/pastries red meat, poultry, eggs as recommended by American College of  Lifestyle Medicine (ACLM).  -Type 2 diabetes is known to coexist with other important comorbidities such as high blood pressure and high cholesterol.  It is critical to control not only the  diabetes but also the high blood pressure and high cholesterol to minimize and delay the risk of complications including coronary artery disease, stroke, amputations, blindness, etc.  The good news is that this diet recommendation for type 2 diabetes is also very helpful for managing high cholesterol and high blood blood pressure.  - Studies showed that people with diabetes will benefit from a class of medications known as ACE inhibitors and statins.  Unless there are specific reasons not to be on these medications, the standard of care is to consider getting one from these groups of medications at an optimal doses.  These medications are generally considered safe and proven to help protect the heart and the kidneys.    - People with diabetes are encouraged to initiate and maintain regular follow-up with eye doctors, foot doctors, dentists , and if necessary heart and kidney doctors.     - It is highly recommended that people with diabetes quit smoking or stay away from smoking, and get yearly  flu vaccine and pneumonia vaccine at least every 5 years.  See above for additional recommendations on exercise, sleep, stress management , and healthy social connections.      

## 2022-01-18 NOTE — Progress Notes (Unsigned)
01/18/2022, 8:14 PM   Endocrinology follow-up note  Subjective:    Patient ID: Gloria Webb, female    DOB: [birth year].  Gloria Webb is being seen in follow-up after she was seen in consultation for management of type 2 diabetes, toxic multinodular goiter, hyperlipidemia, hypertension.    PCP: Thornton Papas, PA-C.   Past Medical History:  Diagnosis Date   Diabetes mellitus, type II ([place])    Hypertension    Thyroid disease     Past Surgical History:  Procedure Laterality Date   broken ankle      Social History   Socioeconomic History   Marital status: Divorced    Spouse name: Not on file   Number of children: Not on file   Years of education: Not on file   Highest education level: Not on file  Occupational History   Not on file  Tobacco Use   Smoking status: Former    Types: Cigarettes   Smokeless tobacco: Not on file  Vaping Use   Vaping Use: Never used  Substance and Sexual Activity   Alcohol use: Not Currently   Drug use: Not Currently   Sexual activity: Not on file  Other Topics Concern   Not on file  Social History Narrative   Not on file   Social Determinants of Health   Financial Resource Strain: Not on file  Food Insecurity: Not on file  Transportation Needs: Not on file  Physical Activity: Not on file  Stress: Not on file  Social Connections: Not on file    Family History  Problem Relation Age of Onset   Hypertension Mother     Outpatient Encounter Medications as of 01/18/2022  Medication Sig   AMLODIPINE BESYLATE PO Take 5 mg by mouth daily.   atorvastatin (LIPITOR) 20 MG tablet Take 20 mg by mouth daily.   Blood Glucose Monitoring Suppl (ACCU-CHEK GUIDE ME) w/Device KIT 1 Piece by Does not apply route as directed.   carvedilol (COREG) 25 MG tablet Take 25 mg by mouth 2 (two) times daily.   cetirizine (ZYRTEC) 10 MG tablet Take 10 mg by mouth daily.    glucose blood (ACCU-CHEK GUIDE) test strip Use as instructed   hydrALAZINE (APRESOLINE) 50 MG tablet Take 100 mg by mouth 2 (two) times daily.   insulin aspart protamine - aspart (NOVOLOG MIX 70/30 FLEXPEN) (70-30) 100 UNIT/ML FlexPen Inject 20-25 Units into the skin 2 (two) times daily with a meal.   JARDIANCE 10 MG TABS tablet Take 10 mg by mouth every morning.   losartan (COZAAR) 100 MG tablet Take 100 mg by mouth daily.   ondansetron (ZOFRAN) 4 MG tablet Take 4 mg by mouth every 8 (eight) hours as needed for nausea or vomiting.   Vitamin D, Ergocalciferol, (DRISDOL) 1.25 MG (50000 UNIT) CAPS capsule Take 50,000 Units by mouth every 14 (fourteen) days.   [DISCONTINUED] insulin aspart protamine - aspart (NOVOLOG MIX 70/30 FLEXPEN) (70-30) 100 UNIT/ML FlexPen Inject 20 Units into the skin 2 (two) times daily with a meal.   No facility-administered encounter medications on file as of 01/18/2022.    ALLERGIES: Allergies  Allergen Reactions   Methimazole Swelling    Pt states  she experienced swelling of her face and tongue    VACCINATION STATUS:  There is no immunization history on file for this patient.  Diabetes She presents for her follow-up diabetic visit. She has type 2 diabetes mellitus. Onset time: She was diagnosed at approximate age of 71 years. Her disease course has been improving. There are no hypoglycemic associated symptoms. Pertinent negatives for hypoglycemia include no confusion, headaches, pallor or seizures. Pertinent negatives for diabetes include no chest pain, no fatigue, no polydipsia, no polyphagia and no polyuria. There are no hypoglycemic complications. Symptoms are improving. Diabetic complications include nephropathy. Risk factors for coronary artery disease include diabetes mellitus, dyslipidemia, family history, obesity, hypertension, sedentary lifestyle and post-menopausal. Current diabetic treatment includes insulin injections. Her weight is fluctuating  minimally. She is following a generally unhealthy diet. When asked about meal planning, she reported none. She has not had a previous visit with a dietitian. She never participates in exercise. Her home blood glucose trend is decreasing steadily. Her breakfast blood glucose range is generally 140-180 mg/dl. Her lunch blood glucose range is generally 140-180 mg/dl. Her dinner blood glucose range is generally 140-180 mg/dl. Her bedtime blood glucose range is generally 140-180 mg/dl. Her overall blood glucose range is 140-180 mg/dl. (She presents with her CGM showing improved glycemic profile.  Her libre AGP shows 75% time range, 20% level 1 hyperglycemia, 3% level 2 hyperglycemia.  She has 2% level of 1 hypoglycemia.  Her average blood glucose is 155 mg per DL.   During her last visit, her point-of-care A1c was 8.8 at increasing from 7.2%.      ) An ACE inhibitor/angiotensin II receptor blocker is being taken. Eye exam is current.  Hyperlipidemia This is a chronic problem. The current episode started more than 1 year ago. The problem is controlled. Exacerbating diseases include chronic renal disease, diabetes and obesity. Pertinent negatives include no chest pain, myalgias or shortness of breath. Current antihyperlipidemic treatment includes statins. Risk factors for coronary artery disease include diabetes mellitus, hypertension, obesity, a sedentary lifestyle, post-menopausal and family history.  Hypertension This is a chronic problem. The current episode started more than 1 year ago. Pertinent negatives include no chest pain, headaches, palpitations or shortness of breath. Risk factors for coronary artery disease include dyslipidemia, diabetes mellitus, obesity, sedentary lifestyle, family history and post-menopausal state. Past treatments include angiotensin blockers, beta blockers, calcium channel blockers and direct vasodilators. Identifiable causes of hypertension include chronic renal disease.      Review of Systems  Constitutional:  Negative for chills, fatigue, fever and unexpected weight change.  HENT:  Negative for trouble swallowing and voice change.   Eyes:  Negative for visual disturbance.  Respiratory:  Negative for cough, shortness of breath and wheezing.   Cardiovascular:  Negative for chest pain, palpitations and leg swelling.  Gastrointestinal:  Negative for diarrhea, nausea and vomiting.  Endocrine: Negative for cold intolerance, heat intolerance, polydipsia, polyphagia and polyuria.  Musculoskeletal:  Negative for arthralgias and myalgias.  Skin:  Negative for color change, pallor, rash and wound.  Neurological:  Negative for seizures and headaches.  Psychiatric/Behavioral:  Negative for confusion and suicidal ideas.     Objective:       01/18/2022    3:27 PM 11/16/2021    2:16 PM 09/14/2021    2:07 PM  Vitals with BMI  Height _0  _1  _2   Weight 159 lbs 158 lbs 3 oz 154 lbs  BMI 28.17 88.50 27.74  Systolic 128 786 767  Diastolic 74 78 82  Pulse 56 64 56    BP 132/74   Pulse (!) 56   Ht _0  (1.6 m)   Wt 159 lb (72.1 kg)   BMI 28.17 kg/m   Wt Readings from Last 3 Encounters:  01/18/22 159 lb (72.1 kg)  11/16/21 158 lb 3.2 oz (71.8 kg)  09/14/21 154 lb (69.9 kg)       CMP ( most recent) CMP     Component Value Date/Time   NA 137 01/09/2022 1158   NA 142 04/05/2021 0000   K 4.0 01/09/2022 1158   CL 106 01/09/2022 1158   CO2 22 01/09/2022 1158   GLUCOSE 206 (H) 01/09/2022 1158   BUN 40 (H) 01/09/2022 1158   BUN 36 (A) 04/05/2021 0000   CREATININE 2.52 (H) 01/09/2022 1158   CALCIUM 9.1 01/09/2022 1158   PROT 7.2 01/09/2022 1158   ALBUMIN 3.3 (L) 01/09/2022 1158   AST 16 01/09/2022 1158   ALT 13 01/09/2022 1158   ALKPHOS 80 01/09/2022 1158   BILITOT 0.7 01/09/2022 1158   GFRNONAA 20 (L) 01/09/2022 1158   GFRAA 32 06/27/2019 0000     Diabetic Labs (most recent): Lab Results  Component Value Date   HGBA1C 7.1 (A)  01/18/2022   HGBA1C 8.8 (A) 09/14/2021   HGBA1C 7.2 (A) 06/13/2021   MICROALBUR 150 05/20/2020     Lipid Panel ( most recent) Lipid Panel     Component Value Date/Time   CHOL 140 01/09/2022 1158   TRIG 64 01/09/2022 1158   HDL 48 01/09/2022 1158   CHOLHDL 2.9 01/09/2022 1158   VLDL 13 01/09/2022 1158   LDLCALC 79 01/09/2022 1158      Lab Results  Component Value Date   TSH 1.755 01/09/2022   TSH 0.043 (L) 11/04/2021   TSH 0.010 (L) 09/06/2021   TSH 0.38 (A) 04/05/2021   TSH 0.362 01/18/2021   TSH 0.05 (A) 11/26/2020   TSH 0.265 (L) 12/30/2019   TSH 0.062 (L) 10/09/2019   TSH 7.30 (A) 06/27/2019   FREET4 0.96 01/09/2022   FREET4 1.13 (H) 11/04/2021   FREET4 1.45 (H) 09/06/2021   FREET4 1.03 01/18/2021   FREET4 1.01 12/30/2019   FREET4 1.72 10/09/2019      Assessment & Plan:   1. Uncontrolled type 2 diabetes mellitus with hyperglycemia ([facility])  - Andee Chivers has currently uncontrolled symptomatic type 2 DM since  71 years of age.  She presents with her CGM showing improved glycemic profile.  Her libre AGP shows 75% time range, 20% level 1 hyperglycemia, 3% level 2 hyperglycemia.  She has 2% level of 1 hypoglycemia.  Her average blood glucose is 155 mg per DL.   During her last visit, her point-of-care A1c was 8.8 at increasing from 7.2%.     Recent labs reviewed. - I had a long discussion with her about the progressive nature of diabetes and the pathology behind its complications. -her diabetes is complicated by CKD, obesity/sedentary life and she remains at a high risk for more acute and chronic complications which include CAD, CVA, CKD, retinopathy, and neuropathy. These are all discussed in detail with her.  - I have counseled her on diet  and weight management  by adopting a carbohydrate restricted/protein rich diet. Patient is encouraged to switch to  unprocessed or minimally processed     complex starch and increased protein intake (animal or plant source),  fruits, and vegetables. -  she is advised to stick  to a routine mealtimes to eat 3 meals  a day and avoid unnecessary snacks ( to snack only to correct hypoglycemia).   - she acknowledges that there is a room for improvement in her food and drink choices. - Suggestion is made for her to avoid simple carbohydrates  from her diet including Cakes, Sweet Desserts, Ice Cream, Soda (diet and regular), Sweet Tea, Candies, Chips, Cookies, Store Bought Juices, Alcohol in Excess of  1-2 drinks a day, Artificial Sweeteners,  Coffee Creamer, and "Sugar-free" Products, Lemonade. This will help patient to have more stable blood glucose profile and potentially avoid unintended weight gain.  - she will be scheduled with Jearld Fenton, RDN, CDE for diabetes education.  - I have approached her with the following individualized plan to manage  her diabetes and patient agrees:   -She presents with improved glycemic profile.  She is advised to continue NovoLog 70/30 20 units with breakfast and 20 units with supper  for [place] blood glucose readings above 90 mg per DL.     She will continue to benefit from Jardiance, 10 mg p.o. daily at breakfast.   She is benefiting from her CGM, advised to continue to wear her CGM at all times. -She is encouraged to call clinic for hypoglycemia below 70 or hyperglycemia above 200 mg per DL x 3.   - Specific targets for  A1c;  LDL, HDL,  and Triglycerides were discussed with the patient.  2) Blood Pressure /Hypertension:  -Her blood pressure is controlled to target. she is advised to continue her current medications including losartan 25 mg p.o. daily, carvedilol 25 mg p.o. twice daily, hydralazine 50 mg p.o. twice daily.  -She was given potassium supplement which has helped correct her potassium to 4 mmol/L.  She is advised to discontinue potassium.      3) Lipids/Hyperlipidemia:   Review of her recent lipid panel showed uncontrolled LDL at 94.     she  is advised to  continue atorvastatin 20 mg p.o. daily at bedtime.   She will be considered for fasting lipid panel at subsequent visits.    -  Side effects and precautions discussed with her.  4)  Weight/Diet:  Body mass index is 28.17 kg/m.  -     she is  a candidate for weight loss. I discussed with her the fact that loss of 5 - 10% of her  current body weight will have the most impact on her diabetes management.  Exercise, and detailed carbohydrates information provided  -  detailed on discharge instructions.  5) toxic multinodular goiter: -Her medical records were reviewed.  She underwent bilateral fine-needle aspiration of nodules on both lobes of the thyroid with benign findings.  She was treated with methimazole with partial response.  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  She has compressive symptoms, however wishes to avoid surgery.  Options were discussed with her including radioactive iodine thyroid ablation.    She agrees with this option.  I have ordered RAI to be done in the next several days in [facility].   She is made aware of subsequent hypothyroidism which will need thyroid hormone replacement for life.   6) Chronic Care/Health Maintenance:  -she  is on ACEI/ARB and Statin medications and  is encouraged to initiate and continue to follow up with Ophthalmology, Dentist,  Podiatrist at least yearly or according to recommendations, and advised to   stay away from smoking. I have recommended yearly  flu vaccine and pneumonia vaccine at least every 5 years; moderate intensity exercise for up to 150 minutes weekly; and  sleep for at least 7 hours a day.  She did have normal screening point-of-care ABI in December 2022.   This study will be repeated in December 2026, or sooner if needed. - she is  advised to maintain close follow up with Thornton Papas, PA-C for primary care needs, as well as her other providers for optimal and coordinated  care.   I spent 41 minutes in the care of the patient today including review of labs from [place], Lipids, Thyroid Function, Hematology (current and previous including abstractions from other facilities); face-to-face time discussing  her blood glucose readings/logs, discussing hypoglycemia and hyperglycemia episodes and symptoms, medications doses, her options of short and long term treatment based on the latest standards of care / guidelines;  discussion about incorporating lifestyle medicine;  and documenting the encounter. Risk reduction counseling performed per USPSTF guidelines to reduce  obesity and cardiovascular risk factors.     Please refer to Patient Instructions for Blood Glucose Monitoring and Insulin/Medications Dosing Guide"  in media tab for additional information. Please  also refer to " Patient Self Inventory" in the Media  tab for reviewed elements of pertinent patient history.  Hayes Ludwig participated in the discussions, expressed understanding, and voiced agreement with the above plans.  All questions were answered to her satisfaction. she is encouraged to contact clinic should she have any questions or concerns prior to her return visit.   Follow up plan: - Return in about 3 months (around 04/19/2022) for F/U with Pre-visit Labs, Meter/CGM/Logs, A1c here.  Glade Lloyd, MD [facility] [address] [place], [place] [address] Phone: [phone]  Fax: [phone]    01/18/2022, 8:14 PM  This note was partially dictated with voice recognition software. Similar sounding words can be transcribed inadequately or may not  be corrected upon review.

## 2022-04-11 ENCOUNTER — Other Ambulatory Visit (HOSPITAL_COMMUNITY)
Admission: RE | Admit: 2022-04-11 | Discharge: 2022-04-11 | Disposition: A | Discharge status: Home / Self Care | Payer: Medicare Other | Source: Ambulatory Visit | Attending: "Endocrinology | Admitting: "Endocrinology

## 2022-04-11 DIAGNOSIS — E782 Mixed hyperlipidemia: Secondary | ICD-10-CM | POA: Insufficient documentation

## 2022-04-11 DIAGNOSIS — E1165 Type 2 diabetes mellitus with hyperglycemia: Secondary | ICD-10-CM | POA: Diagnosis present

## 2022-04-11 DIAGNOSIS — E0520 Thyrotoxicosis with toxic multinodular goiter without thyrotoxic crisis or storm: Secondary | ICD-10-CM | POA: Insufficient documentation

## 2022-04-11 LAB — LIPID PANEL
Cholesterol: 145 mg/dL (ref 0–200)
HDL: 48 mg/dL (ref >40)
LDL Cholesterol: 89 mg/dL (ref 0–99)
Total CHOL/HDL Ratio: 3 RATIO
Triglycerides: 40 mg/dL (ref <150)
VLDL: 8 mg/dL (ref 0–40)

## 2022-04-11 LAB — COMPREHENSIVE METABOLIC PANEL
ALT: 15 U/L (ref 0–44)
AST: 21 U/L (ref 15–41)
Albumin: 3.3 g/dL — ABNORMAL LOW (ref 3.5–5.0)
Alkaline Phosphatase: 72 U/L (ref 38–126)
Anion gap: 13 (ref 5–15)
BUN: 57 mg/dL — ABNORMAL HIGH (ref 8–23)
CO2: 25 mmol/L (ref 22–32)
Calcium: 9.6 mg/dL (ref 8.9–10.3)
Chloride: 102 mmol/L (ref 98–111)
Creatinine, Ser: 2.66 mg/dL — ABNORMAL HIGH (ref 0.44–1.00)
GFR, Estimated: 19 mL/min — ABNORMAL LOW (ref >60)
Glucose, Bld: 255 mg/dL — ABNORMAL HIGH (ref 70–99)
Potassium: 4.2 mmol/L (ref 3.5–5.1)
Sodium: 140 mmol/L (ref 135–145)
Total Bilirubin: 0.8 mg/dL (ref 0.3–1.2)
Total Protein: 7.3 g/dL (ref 6.5–8.1)

## 2022-04-11 LAB — T4, FREE: Free T4: 1.27 ng/dL — ABNORMAL HIGH (ref 0.61–1.12)

## 2022-04-11 LAB — TSH: TSH: 1.871 u[IU]/mL (ref 0.350–4.500)

## 2022-04-20 ENCOUNTER — Ambulatory Visit: Payer: Medicare Other | Admitting: "Endocrinology

## 2022-05-01 ENCOUNTER — Encounter: Payer: Self-pay | Admitting: "Endocrinology

## 2022-05-01 ENCOUNTER — Ambulatory Visit (INDEPENDENT_AMBULATORY_CARE_PROVIDER_SITE_OTHER): Payer: Medicare Other | Admitting: "Endocrinology

## 2022-05-01 VITALS — BP 138/72 | HR 56 | Ht 63.0 in | Wt 158.8 lb

## 2022-05-01 DIAGNOSIS — E0520 Thyrotoxicosis with toxic multinodular goiter without thyrotoxic crisis or storm: Secondary | ICD-10-CM | POA: Diagnosis not present

## 2022-05-01 DIAGNOSIS — E1165 Type 2 diabetes mellitus with hyperglycemia: Secondary | ICD-10-CM | POA: Diagnosis not present

## 2022-05-01 DIAGNOSIS — E782 Mixed hyperlipidemia: Secondary | ICD-10-CM | POA: Diagnosis not present

## 2022-05-01 DIAGNOSIS — I10 Essential (primary) hypertension: Secondary | ICD-10-CM

## 2022-05-01 LAB — POCT GLYCOSYLATED HEMOGLOBIN (HGB A1C): HbA1c, POC (controlled diabetic range): 6.7 % (ref 0.0–7.0)

## 2022-05-01 MED ORDER — NOVOLOG MIX 70/30 FLEXPEN (70-30) 100 UNIT/ML ~~LOC~~ SUPN: 16.0000 [IU] | PEN_INJECTOR | Freq: Two times a day (BID) | SUBCUTANEOUS | 1 refills | Status: DC

## 2022-05-01 NOTE — Progress Notes (Signed)
05/01/2022, 2:32 PM   Endocrinology follow-up note  Subjective:    Patient ID: Gloria Webb, female    DOB: November 16, 1950.  Gloria Webb is being seen in follow-up after she was seen in consultation for management of type 2 diabetes, toxic multinodular goiter, hyperlipidemia, hypertension.    PCP: Thornton Papas, PA-C.   Past Medical History:  Diagnosis Date   Diabetes mellitus, type II ([place])    Hypertension    Thyroid disease     Past Surgical History:  Procedure Laterality Date   broken ankle      Social History   Socioeconomic History   Marital status: Divorced    Spouse name: Not on file   Number of children: Not on file   Years of education: Not on file   Highest education level: Not on file  Occupational History   Not on file  Tobacco Use   Smoking status: Former    Types: Cigarettes   Smokeless tobacco: Not on file  Vaping Use   Vaping Use: Never used  Substance and Sexual Activity   Alcohol use: Not Currently   Drug use: Not Currently   Sexual activity: Not on file  Other Topics Concern   Not on file  Social History Narrative   Not on file   Social Determinants of Health   Financial Resource Strain: Not on file  Food Insecurity: Not on file  Transportation Needs: Not on file  Physical Activity: Not on file  Stress: Not on file  Social Connections: Not on file    Family History  Problem Relation Age of Onset   Hypertension Mother     Outpatient Encounter Medications as of 05/01/2022  Medication Sig   AMLODIPINE BESYLATE PO Take 5 mg by mouth daily.   atorvastatin (LIPITOR) 20 MG tablet Take 20 mg by mouth daily.   Blood Glucose Monitoring Suppl (ACCU-CHEK GUIDE ME) w/Device KIT 1 Piece by Does not apply route as directed.   carvedilol (COREG) 25 MG tablet Take 25 mg by mouth 2 (two) times daily.   cetirizine (ZYRTEC) 10 MG tablet Take 10 mg by mouth daily.    glucose blood (ACCU-CHEK GUIDE) test strip Use as instructed   hydrALAZINE (APRESOLINE) 50 MG tablet Take 100 mg by mouth 2 (two) times daily.   insulin aspart protamine - aspart (NOVOLOG MIX 70/30 FLEXPEN) (70-30) 100 UNIT/ML FlexPen Inject 16 Units into the skin 2 (two) times daily with a meal.   JARDIANCE 10 MG TABS tablet Take 10 mg by mouth every morning.   losartan (COZAAR) 100 MG tablet Take 100 mg by mouth daily.   ondansetron (ZOFRAN) 4 MG tablet Take 4 mg by mouth every 8 (eight) hours as needed for nausea or vomiting.   Vitamin D, Ergocalciferol, (DRISDOL) 1.25 MG (50000 UNIT) CAPS capsule Take 50,000 Units by mouth every 14 (fourteen) days.   [DISCONTINUED] insulin aspart protamine - aspart (NOVOLOG MIX 70/30 FLEXPEN) (70-30) 100 UNIT/ML FlexPen Inject 20-25 Units into the skin 2 (two) times daily with a meal. (Patient taking differently: Inject 10-12 Units into the skin 2 (two) times daily with a meal.)   No facility-administered encounter medications on file as of 05/01/2022.  ALLERGIES: Allergies  Allergen Reactions   Methimazole Swelling    Pt states she experienced swelling of her face and tongue    VACCINATION STATUS:  There is no immunization history on file for this patient.  Diabetes She presents for her follow-up diabetic visit. She has type 2 diabetes mellitus. Onset time: She was diagnosed at approximate age of 36 years. Her disease course has been improving. There are no hypoglycemic associated symptoms. Pertinent negatives for hypoglycemia include no confusion, headaches, pallor or seizures. Pertinent negatives for diabetes include no chest pain, no fatigue, no polydipsia, no polyphagia and no polyuria. There are no hypoglycemic complications. Symptoms are improving. Diabetic complications include nephropathy. Risk factors for coronary artery disease include diabetes mellitus, dyslipidemia, family history, obesity, hypertension, sedentary lifestyle and  post-menopausal. Current diabetic treatment includes insulin injections. Her weight is stable. She is following a generally unhealthy diet. When asked about meal planning, she reported none. She has not had a previous visit with a dietitian. She never participates in exercise. Her home blood glucose trend is decreasing steadily. Her breakfast blood glucose range is generally 140-180 mg/dl. Her lunch blood glucose range is generally 140-180 mg/dl. Her dinner blood glucose range is generally 140-180 mg/dl. Her bedtime blood glucose range is generally 140-180 mg/dl. Her overall blood glucose range is 140-180 mg/dl. (She presents with improved glycemic profile.  Her CGM was analyzed, AGP shows 86% time in range, 12% level 1 hyperglycemia.  She has 2% level 1 hypoglycemia.  Her point-of-care A1c is 6.7%, progressively improving from 8.8%.  She has worsening renal function.  She remains on Jardiance.       ) An ACE inhibitor/angiotensin II receptor blocker is being taken. Eye exam is current.  Hyperlipidemia This is a chronic problem. The current episode started more than 1 year ago. The problem is controlled. Exacerbating diseases include chronic renal disease, diabetes and obesity. Pertinent negatives include no chest pain, myalgias or shortness of breath. Current antihyperlipidemic treatment includes statins. Risk factors for coronary artery disease include diabetes mellitus, hypertension, obesity, a sedentary lifestyle, post-menopausal and family history.  Hypertension This is a chronic problem. The current episode started more than 1 year ago. Pertinent negatives include no chest pain, headaches, palpitations or shortness of breath. Risk factors for coronary artery disease include dyslipidemia, diabetes mellitus, obesity, sedentary lifestyle, family history and post-menopausal state. Past treatments include angiotensin blockers, beta blockers, calcium channel blockers and direct vasodilators. Identifiable  causes of hypertension include chronic renal disease.     Review of Systems  Constitutional:  Negative for chills, fatigue, fever and unexpected weight change.  HENT:  Negative for trouble swallowing and voice change.   Eyes:  Negative for visual disturbance.  Respiratory:  Negative for cough, shortness of breath and wheezing.   Cardiovascular:  Negative for chest pain, palpitations and leg swelling.  Gastrointestinal:  Negative for diarrhea, nausea and vomiting.  Endocrine: Negative for cold intolerance, heat intolerance, polydipsia, polyphagia and polyuria.  Musculoskeletal:  Negative for arthralgias and myalgias.  Skin:  Negative for color change, pallor, rash and wound.  Neurological:  Negative for seizures and headaches.  Psychiatric/Behavioral:  Negative for confusion and suicidal ideas.     Objective:       05/01/2022    2:02 PM 01/18/2022    3:27 PM 11/16/2021    2:16 PM  Vitals with BMI  Height 5\' 3"  5\' 3"  5\' 3"   Weight 158 lbs 13 oz 159 lbs 158 lbs 3 oz  BMI 28.14  0000000 [id number]  Systolic 0000000 [id number]  Diastolic 72 74 78  Pulse 56 56 64    BP 138/72   Pulse (!) 56   Ht 5\' 3"  (1.6 m)   Wt 158 lb 12.8 oz (72 kg)   BMI 28.13 kg/m   Wt Readings from Last 3 Encounters:  05/01/22 158 lb 12.8 oz (72 kg)  01/18/22 159 lb (72.1 kg)  11/16/21 158 lb 3.2 oz (71.8 kg)       CMP ( most recent) CMP     Component Value Date/Time   NA 140 04/11/2022 1156   NA 142 04/05/2021 0000   K 4.2 04/11/2022 1156   CL 102 04/11/2022 1156   CO2 25 04/11/2022 1156   GLUCOSE 255 (H) 04/11/2022 1156   BUN 57 (H) 04/11/2022 1156   BUN 36 (A) 04/05/2021 0000   CREATININE 2.66 (H) 04/11/2022 1156   CALCIUM 9.6 04/11/2022 1156   PROT 7.3 04/11/2022 1156   ALBUMIN 3.3 (L) 04/11/2022 1156   AST 21 04/11/2022 1156   ALT 15 04/11/2022 1156   ALKPHOS 72 04/11/2022 1156   BILITOT 0.8 04/11/2022 1156   GFRNONAA 19 (L) 04/11/2022 1156   GFRAA 32 06/27/2019 0000     Diabetic Labs  (most recent): Lab Results  Component Value Date   HGBA1C 6.7 05/01/2022   HGBA1C 7.1 (A) 01/18/2022   HGBA1C 8.8 (A) 09/14/2021   MICROALBUR 150 05/20/2020     Lipid Panel ( most recent) Lipid Panel     Component Value Date/Time   CHOL 145 04/11/2022 1156   TRIG 40 04/11/2022 1156   HDL 48 04/11/2022 1156   CHOLHDL 3.0 04/11/2022 1156   VLDL 8 04/11/2022 1156   LDLCALC 89 04/11/2022 1156      Lab Results  Component Value Date   TSH 1.871 04/11/2022   TSH 1.755 01/09/2022   TSH 0.043 (L) 11/04/2021   TSH 0.010 (L) 09/06/2021   TSH 0.38 (A) 04/05/2021   TSH 0.362 01/18/2021   TSH 0.05 (A) 11/26/2020   TSH 0.265 (L) 12/30/2019   TSH 0.062 (L) 10/09/2019   TSH 7.30 (A) 06/27/2019   FREET4 1.27 (H) 04/11/2022   FREET4 0.96 01/09/2022   FREET4 1.13 (H) 11/04/2021   FREET4 1.45 (H) 09/06/2021   FREET4 1.03 01/18/2021   FREET4 1.01 12/30/2019   FREET4 1.72 10/09/2019      Assessment & Plan:   1. Uncontrolled type 2 diabetes mellitus with hyperglycemia ([facility])  - Gloria Webb has currently uncontrolled symptomatic type 2 DM since  72 years of age.  She presents with improved glycemic profile.  Her CGM was analyzed, AGP shows 86% time in range, 12% level 1 hyperglycemia.  She has 2% level 1 hypoglycemia.  Her point-of-care A1c is 6.7%, progressively improving from 8.8%.  She has worsening renal function.  She remains on Jardiance.     Recent labs reviewed. - I had a long discussion with her about the progressive nature of diabetes and the pathology behind its complications. -her diabetes is complicated by CKD, obesity/sedentary life and she remains at a high risk for more acute and chronic complications which include CAD, CVA, CKD, retinopathy, and neuropathy. These are all discussed in detail with her.  - I have counseled her on diet  and weight management  by adopting a carbohydrate restricted/protein rich diet. Patient is encouraged to switch to  unprocessed or  minimally processed     complex starch and increased protein intake (animal  or plant source), fruits, and vegetables. -  she is advised to stick to a routine mealtimes to eat 3 meals  a day and avoid unnecessary snacks ( to snack only to correct hypoglycemia).   - she acknowledges that there is a room for improvement in her food and drink choices. - Suggestion is made for her to avoid simple carbohydrates  from her diet including Cakes, Sweet Desserts, Ice Cream, Soda (diet and regular), Sweet Tea, Candies, Chips, Cookies, Store Bought Juices, Alcohol in Excess of  1-2 drinks a day, Artificial Sweeteners,  Coffee Creamer, and "Sugar-free" Products, Lemonade. This will help patient to have more stable blood glucose profile and potentially avoid unintended weight gain.  - she will be scheduled with Jearld Fenton, RDN, CDE for diabetes education.  - I have approached her with the following individualized plan to manage  her diabetes and patient agrees:   -She presents with continued improvement in her glycemic profile.  She is responding to the premixed insulin.  In light of her worsening renal function with GFR at 19, she is advised to discontinue Jardiance at this time.  -She has been using lowered dose of her insulin.  She is advised to increase NovoLog 70/30 60 units with breakfast and 16 units with supper   for [place] blood glucose readings above 90 mg per DL.    She is benefiting from her CGM, advised to continue to wear her CGM at all times. -She is encouraged to call clinic for hypoglycemia below 70 or hyperglycemia above 200 mg per DL x 3.   - Specific targets for  A1c;  LDL, HDL,  and Triglycerides were discussed with the patient.  2) Blood Pressure /Hypertension:  -Her blood pressure is controlled to target. she is advised to continue her current medications including losartan 25 mg p.o. daily, carvedilol 25 mg p.o. twice daily, hydralazine 50 mg p.o. twice daily.  -She was given  potassium supplement which has helped correct her potassium to 4 mmol/L.  She is advised to discontinue potassium.      3) Lipids/Hyperlipidemia:   Review of her recent lipid panel showed improved LDL at 79, from 94.      she  is advised to continue atorvastatin 20 mg p.o. daily at bedtime.    She will be considered for fasting lipid panel at subsequent visits.    -  Side effects and precautions discussed with her.  4)  Weight/Diet:  Body mass index is 28.13 kg/m.  -     she is  a candidate for weight loss. I discussed with her the fact that loss of 5 - 10% of her  current body weight will have the most impact on her diabetes management.  Exercise, and detailed carbohydrates information provided  -  detailed on discharge instructions.  5) toxic multinodular goiter: -Her medical records were reviewed.  She underwent bilateral fine-needle aspiration of nodules on both lobes of the thyroid with benign findings.  Was previously treated with methimazole with incomplete response.  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  She received this treatment on December 01, 2021.  Her subsequent thyroid function tests are consistent with treatment effect, did not develop hypothyroidism, infarct her free T4 is slightly above target.  She would not be initiated on thyroid hormone supplement at this time.  She will have repeat thyroid function test before her next visit in 3 months.  There is some possibility that this is treatment failure, in which case she will be considered for low-dose methimazole.  6) Chronic Care/Health Maintenance:  -she  is on ACEI/ARB and Statin medications and  is encouraged to initiate and continue to follow up with Ophthalmology, Dentist,  Podiatrist at least yearly or according to recommendations, and advised to   stay away from smoking. I have recommended yearly flu vaccine and  pneumonia vaccine at least every 5 years; moderate intensity exercise for up to 150 minutes weekly; and  sleep for at least 7 hours a day.  She did have normal screening point-of-care ABI in December 2022.   This study will be repeated in December 2026, or sooner if needed.  - she is  advised to maintain close follow up with Thornton Papas, PA-C for primary care needs, as well as her other providers for optimal and coordinated care.   I spent   26 minutes in the care of the patient today including review of labs from [facility], Lipids, Thyroid Function, Hematology (current and previous including abstractions from other facilities); face-to-face time discussing  her blood glucose readings/logs, discussing hypoglycemia and hyperglycemia episodes and symptoms, medications doses, her options of short and long term treatment based on the latest standards of care / guidelines;  discussion about incorporating lifestyle medicine;  and documenting the encounter. Risk reduction counseling performed per USPSTF guidelines to reduce  cardiovascular risk factors.     Please refer to Patient Instructions for Blood Glucose Monitoring and Insulin/Medications Dosing Guide"  in media tab for additional information. Please  also refer to " Patient Self Inventory" in the Media  tab for reviewed elements of pertinent patient history.  Gloria Webb participated in the discussions, expressed understanding, and voiced agreement with the above plans.  All questions were answered to her satisfaction. she is encouraged to contact clinic should she have any questions or concerns prior to her return visit.   Follow up plan: - Return in about 3 months (around 08/01/2022) for F/U with Pre-visit Labs, Meter/CGM/Logs, A1c here.  Glade Lloyd, MD [facility] [address] [place], [place] [address] Phone: [phone]  Fax: [phone]    05/01/2022, 2:32 PM  This note was  partially dictated with voice recognition software. Similar sounding words can be transcribed inadequately or may not  be corrected upon review.

## 2022-07-25 ENCOUNTER — Other Ambulatory Visit (HOSPITAL_COMMUNITY)
Admission: RE | Admit: 2022-07-25 | Discharge: 2022-07-25 | Disposition: A | Discharge status: Home / Self Care | Payer: Medicare Other | Source: Ambulatory Visit | Attending: "Endocrinology | Admitting: "Endocrinology

## 2022-07-25 DIAGNOSIS — E0520 Thyrotoxicosis with toxic multinodular goiter without thyrotoxic crisis or storm: Secondary | ICD-10-CM | POA: Diagnosis present

## 2022-07-25 LAB — T4, FREE: Free T4: 1.08 ng/dL (ref 0.61–1.12)

## 2022-07-25 LAB — TSH: TSH: 1.78 u[IU]/mL (ref 0.350–4.500)

## 2022-07-27 LAB — T3, FREE: T3, Free: 2.1 pg/mL (ref 2.0–4.4)

## 2022-08-03 ENCOUNTER — Encounter: Payer: Self-pay | Admitting: "Endocrinology

## 2022-08-03 ENCOUNTER — Ambulatory Visit (INDEPENDENT_AMBULATORY_CARE_PROVIDER_SITE_OTHER): Payer: Medicare Other | Admitting: "Endocrinology

## 2022-08-03 VITALS — BP 110/64 | HR 64 | Ht 63.0 in | Wt 160.4 lb

## 2022-08-03 DIAGNOSIS — Z794 Long term (current) use of insulin: Secondary | ICD-10-CM | POA: Diagnosis not present

## 2022-08-03 DIAGNOSIS — E1165 Type 2 diabetes mellitus with hyperglycemia: Secondary | ICD-10-CM | POA: Diagnosis not present

## 2022-08-03 DIAGNOSIS — I10 Essential (primary) hypertension: Secondary | ICD-10-CM | POA: Diagnosis not present

## 2022-08-03 DIAGNOSIS — E0520 Thyrotoxicosis with toxic multinodular goiter without thyrotoxic crisis or storm: Secondary | ICD-10-CM | POA: Diagnosis not present

## 2022-08-03 DIAGNOSIS — E782 Mixed hyperlipidemia: Secondary | ICD-10-CM | POA: Diagnosis not present

## 2022-08-03 LAB — POCT GLYCOSYLATED HEMOGLOBIN (HGB A1C): HbA1c, POC (controlled diabetic range): 6.9 % (ref 0.0–7.0)

## 2022-08-03 MED ORDER — NOVOLOG MIX 70/30 FLEXPEN (70-30) 100 UNIT/ML ~~LOC~~ SUPN: 16.0000 [IU] | PEN_INJECTOR | Freq: Two times a day (BID) | SUBCUTANEOUS | 1 refills | Status: DC

## 2022-08-03 NOTE — Progress Notes (Signed)
08/03/2022, 6:53 PM   Endocrinology follow-up note  Subjective:    Patient ID: Gloria Webb, female    DOB: February 11, 1950.  Sussan Meter is being seen in follow-up after she was seen in consultation for management of type 2 diabetes, toxic multinodular goiter, hyperlipidemia, hypertension.    PCP: Grayland Jack, PA-C.   Past Medical History:  Diagnosis Date   Diabetes mellitus, type II (HCC)    Hypertension    Thyroid disease     Past Surgical History:  Procedure Laterality Date   broken ankle      Social History   Socioeconomic History   Marital status: Divorced    Spouse name: Not on file   Number of children: Not on file   Years of education: Not on file   Highest education level: Not on file  Occupational History   Not on file  Tobacco Use   Smoking status: Former    Types: Cigarettes   Smokeless tobacco: Not on file  Vaping Use   Vaping Use: Never used  Substance and Sexual Activity   Alcohol use: Not Currently   Drug use: Not Currently   Sexual activity: Not on file  Other Topics Concern   Not on file  Social History Narrative   Not on file   Social Determinants of Health   Financial Resource Strain: Not on file  Food Insecurity: Not on file  Transportation Needs: Not on file  Physical Activity: Not on file  Stress: Not on file  Social Connections: Not on file    Family History  Problem Relation Age of Onset   Hypertension Mother     Outpatient Encounter Medications as of 08/03/2022  Medication Sig   AMLODIPINE BESYLATE PO Take 5 mg by mouth daily.   atorvastatin (LIPITOR) 20 MG tablet Take 20 mg by mouth daily.   Blood Glucose Monitoring Suppl (ACCU-CHEK GUIDE ME) w/Device KIT 1 Piece by Does not apply route as directed.   carvedilol (COREG) 25 MG tablet Take 25 mg by mouth 2 (two) times daily.   cetirizine (ZYRTEC) 10 MG tablet Take 10 mg by mouth daily.    glucose blood (ACCU-CHEK GUIDE) test strip Use as instructed   hydrALAZINE (APRESOLINE) 50 MG tablet Take 100 mg by mouth 2 (two) times daily.   insulin aspart protamine - aspart (NOVOLOG MIX 70/30 FLEXPEN) (70-30) 100 UNIT/ML FlexPen Inject 16 Units into the skin 2 (two) times daily with a meal.   losartan (COZAAR) 100 MG tablet Take 100 mg by mouth daily.   ondansetron (ZOFRAN) 4 MG tablet Take 4 mg by mouth every 8 (eight) hours as needed for nausea or vomiting.   Vitamin D, Ergocalciferol, (DRISDOL) 1.25 MG (50000 UNIT) CAPS capsule Take 50,000 Units by mouth every 14 (fourteen) days.   [DISCONTINUED] insulin aspart protamine - aspart (NOVOLOG MIX 70/30 FLEXPEN) (70-30) 100 UNIT/ML FlexPen Inject 16 Units into the skin 2 (two) times daily with a meal.   [DISCONTINUED] JARDIANCE 10 MG TABS tablet Take 10 mg by mouth every morning. (Patient not taking: Reported on 08/03/2022)   No facility-administered encounter medications on file as of 08/03/2022.    ALLERGIES: Allergies  Allergen Reactions  Methimazole Swelling    Pt states she experienced swelling of her face and tongue    VACCINATION STATUS:  There is no immunization history on file for this patient.  Diabetes She presents for her follow-up diabetic visit. She has type 2 diabetes mellitus. Onset time: She was diagnosed at approximate age of 35 years. Her disease course has been stable. There are no hypoglycemic associated symptoms. Pertinent negatives for hypoglycemia include no confusion, headaches, pallor or seizures. Pertinent negatives for diabetes include no chest pain, no fatigue, no polydipsia, no polyphagia and no polyuria. There are no hypoglycemic complications. Symptoms are stable. Diabetic complications include nephropathy. Risk factors for coronary artery disease include diabetes mellitus, dyslipidemia, family history, obesity, hypertension, sedentary lifestyle and post-menopausal. Current diabetic treatment includes  insulin injections. Her weight is fluctuating minimally. She is following a generally unhealthy diet. When asked about meal planning, she reported none. She has not had a previous visit with a dietitian. She never participates in exercise. Her home blood glucose trend is fluctuating minimally. Her breakfast blood glucose range is generally 140-180 mg/dl. Her lunch blood glucose range is generally 140-180 mg/dl. Her dinner blood glucose range is generally 140-180 mg/dl. Her bedtime blood glucose range is generally 140-180 mg/dl. Her overall blood glucose range is 140-180 mg/dl. (She presents with stable glycemic profile.  Her AGP report shows 68% time in range, 28% level 1 hyperglycemia.  No significant hypoglycemia.  Her average blood glucose is 153 for the last 14 days.  Her point-of-care A1c is 6.9%.        ) An ACE inhibitor/angiotensin II receptor blocker is being taken. Eye exam is current.  Hyperlipidemia This is a chronic problem. The current episode started more than 1 year ago. The problem is controlled. Exacerbating diseases include chronic renal disease, diabetes and obesity. Pertinent negatives include no chest pain, myalgias or shortness of breath. Current antihyperlipidemic treatment includes statins. Risk factors for coronary artery disease include diabetes mellitus, hypertension, obesity, a sedentary lifestyle, post-menopausal and family history.  Hypertension This is a chronic problem. The current episode started more than 1 year ago. Pertinent negatives include no chest pain, headaches, palpitations or shortness of breath. Risk factors for coronary artery disease include dyslipidemia, diabetes mellitus, obesity, sedentary lifestyle, family history and post-menopausal state. Past treatments include angiotensin blockers, beta blockers, calcium channel blockers and direct vasodilators. Identifiable causes of hypertension include chronic renal disease.     Review of Systems   Constitutional:  Negative for chills, fatigue, fever and unexpected weight change.  HENT:  Negative for trouble swallowing and voice change.   Eyes:  Negative for visual disturbance.  Respiratory:  Negative for cough, shortness of breath and wheezing.   Cardiovascular:  Negative for chest pain, palpitations and leg swelling.  Gastrointestinal:  Negative for diarrhea, nausea and vomiting.  Endocrine: Negative for cold intolerance, heat intolerance, polydipsia, polyphagia and polyuria.  Musculoskeletal:  Negative for arthralgias and myalgias.  Skin:  Negative for color change, pallor, rash and wound.  Neurological:  Negative for seizures and headaches.  Psychiatric/Behavioral:  Negative for confusion and suicidal ideas.     Objective:       08/03/2022    3:24 PM 05/01/2022    2:02 PM 01/18/2022    3:27 PM  Vitals with BMI  Height 5\' 3"  5\' 3"  5\' 3"   Weight 160 lbs 6 oz 158 lbs 13 oz 159 lbs  BMI 28.42 28.14 28.17  Systolic 110 138 034  Diastolic 64 72 74  Pulse 64 56 56    BP 110/64   Pulse 64   Ht 5\' 3"  (1.6 m)   Wt 160 lb 6.4 oz (72.8 kg)   BMI 28.41 kg/m   Wt Readings from Last 3 Encounters:  08/03/22 160 lb 6.4 oz (72.8 kg)  05/01/22 158 lb 12.8 oz (72 kg)  01/18/22 159 lb (72.1 kg)       CMP ( most recent) CMP     Component Value Date/Time   NA 140 04/11/2022 1156   NA 142 04/05/2021 0000   K 4.2 04/11/2022 1156   CL 102 04/11/2022 1156   CO2 25 04/11/2022 1156   GLUCOSE 255 (H) 04/11/2022 1156   BUN 57 (H) 04/11/2022 1156   BUN 36 (A) 04/05/2021 0000   CREATININE 2.66 (H) 04/11/2022 1156   CALCIUM 9.6 04/11/2022 1156   PROT 7.3 04/11/2022 1156   ALBUMIN 3.3 (L) 04/11/2022 1156   AST 21 04/11/2022 1156   ALT 15 04/11/2022 1156   ALKPHOS 72 04/11/2022 1156   BILITOT 0.8 04/11/2022 1156   GFRNONAA 19 (L) 04/11/2022 1156   GFRAA 32 06/27/2019 0000     Diabetic Labs (most recent): Lab Results  Component Value Date   HGBA1C 6.9 08/03/2022   HGBA1C  6.7 05/01/2022   HGBA1C 7.1 (A) 01/18/2022   MICROALBUR 150 05/20/2020     Lipid Panel ( most recent) Lipid Panel     Component Value Date/Time   CHOL 145 04/11/2022 1156   TRIG 40 04/11/2022 1156   HDL 48 04/11/2022 1156   CHOLHDL 3.0 04/11/2022 1156   VLDL 8 04/11/2022 1156   LDLCALC 89 04/11/2022 1156      Lab Results  Component Value Date   TSH 1.780 07/25/2022   TSH 1.871 04/11/2022   TSH 1.755 01/09/2022   TSH 0.043 (L) 11/04/2021   TSH 0.010 (L) 09/06/2021   TSH 0.38 (A) 04/05/2021   TSH 0.362 01/18/2021   TSH 0.05 (A) 11/26/2020   TSH 0.265 (L) 12/30/2019   TSH 0.062 (L) 10/09/2019   FREET4 1.08 07/25/2022   FREET4 1.27 (H) 04/11/2022   FREET4 0.96 01/09/2022   FREET4 1.13 (H) 11/04/2021   FREET4 1.45 (H) 09/06/2021   FREET4 1.03 01/18/2021   FREET4 1.01 12/30/2019   FREET4 1.72 10/09/2019      Assessment & Plan:   1. Uncontrolled type 2 diabetes mellitus with hyperglycemia (HCC)  - Wynette Jersey has currently uncontrolled symptomatic type 2 DM since  72 years of age.  She presents with stable glycemic profile.  Her AGP report shows 68% time in range, 28% level 1 hyperglycemia.  No significant hypoglycemia.  Her average blood glucose is 153 for the last 14 days.  Her point-of-care A1c is 6.9%.     Recent labs reviewed. - I had a long discussion with her about the progressive nature of diabetes and the pathology behind its complications. -her diabetes is complicated by CKD, obesity/sedentary life and she remains at a high risk for more acute and chronic complications which include CAD, CVA, CKD, retinopathy, and neuropathy. These are all discussed in detail with her.  - I have counseled her on diet  and weight management  by adopting a carbohydrate restricted/protein rich diet. Patient is encouraged to switch to  unprocessed or minimally processed     complex starch and increased protein intake (animal or plant source), fruits, and vegetables. -  she is  advised to stick to a routine mealtimes to eat  3 meals  a day and avoid unnecessary snacks ( to snack only to correct hypoglycemia).   - she acknowledges that there is a room for improvement in her food and drink choices. - Suggestion is made for her to avoid simple carbohydrates  from her diet including Cakes, Sweet Desserts, Ice Cream, Soda (diet and regular), Sweet Tea, Candies, Chips, Cookies, Store Bought Juices, Alcohol in Excess of  1-2 drinks a day, Artificial Sweeteners,  Coffee Creamer, and "Sugar-free" Products, Lemonade. This will help patient to have more stable blood glucose profile and potentially avoid unintended weight gain.  - she will be scheduled with Norm Salt, RDN, CDE for diabetes education.  - I have approached her with the following individualized plan to manage  her diabetes and patient agrees:   -She presents with continued improvement in her glycemic profile.  She is responding to the premixed insulin.  She is advised to continue NovoLog 70/30 16 units with breakfast and 16 units with supper  for Premeal blood glucose readings above 90 mg per DL.    She is benefiting from her CGM, advised to continue to wear her CGM at all times. -She is encouraged to call clinic for hypoglycemia below 70 or hyperglycemia above 200 mg per DL x 3.   - Specific targets for  A1c;  LDL, HDL,  and Triglycerides were discussed with the patient.  2) Blood Pressure /Hypertension:  -Her blood pressure is controlled to target. she is advised to continue her current medications including losartan 25 mg p.o. daily, carvedilol 25 mg p.o. twice daily, hydralazine 50 mg p.o. twice daily.  She was previously supplemented with low-dose potassium.   3) Lipids/Hyperlipidemia:   Review of her recent lipid panel showed improved LDL at 79, from 94.      she  is advised to continue atorvastatin 20 mg p.o. daily at bedtime.   She will be considered for fasting lipid panel at subsequent visits.     -  Side effects and precautions discussed with her.  4)  Weight/Diet:  Body mass index is 28.41 kg/m.  -     she is  a candidate for weight loss. I discussed with her the fact that loss of 5 - 10% of her  current body weight will have the most impact on her diabetes management.  Exercise, and detailed carbohydrates information provided  -  detailed on discharge instructions.  She is encouraged to stay close to her nephrologist, PMD and other providers.  5) toxic multinodular goiter: -  She underwent bilateral fine-needle aspiration of nodules on both lobes of the thyroid with benign findings.  Was previously treated with methimazole with incomplete response.  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  She received this treatment on December 01, 2021.  Her subsequent thyroid function tests are consistent with treatment effect and euthyroid levels, did not develop hypothyroidism.  She will have repeat thyroid function test before her next visit.  6) Chronic Care/Health Maintenance:  -she  is on ACEI/ARB and Statin medications and  is encouraged to initiate and continue to follow up with Ophthalmology, Dentist,  Podiatrist at least yearly or according to recommendations, and advised to   stay away from smoking. I have recommended yearly flu vaccine and pneumonia vaccine at least every 5 years; moderate intensity exercise for up to 150 minutes weekly; and  sleep for at least 7 hours a day.  She did have normal screening point-of-care ABI in December 2022.   This study will be repeated in December 2026, or sooner if needed.  - she is  advised to maintain close follow up with Grayland Jack, PA-C for primary care needs, as well as her other providers for optimal and coordinated care.     I spent  26  minutes in the care of the patient today including review of labs from CMP, Lipids,  Thyroid Function, Hematology (current and previous including abstractions from other facilities); face-to-face time discussing  her blood glucose readings/logs, discussing hypoglycemia and hyperglycemia episodes and symptoms, medications doses, her options of short and long term treatment based on the latest standards of care / guidelines;  discussion about incorporating lifestyle medicine;  and documenting the encounter. Risk reduction counseling performed per USPSTF guidelines to reduce cardiovascular risk factors.     Please refer to Patient Instructions for Blood Glucose Monitoring and Insulin/Medications Dosing Guide"  in media tab for additional information. Please  also refer to " Patient Self Inventory" in the Media  tab for reviewed elements of pertinent patient history.  Vira Agar participated in the discussions, expressed understanding, and voiced agreement with the above plans.  All questions were answered to her satisfaction. she is encouraged to contact clinic should she have any questions or concerns prior to her return visit.    Follow up plan: - Return in about 3 months (around 11/03/2022) for F/U with Pre-visit Labs, Meter/CGM/Logs, A1c here.  Marquis Lunch, MD [facility] [address] [place], [place] [address] Phone: [phone]  Fax: [phone]    08/03/2022, 6:53 PM  This note was partially dictated with voice recognition software. Similar sounding words can be transcribed inadequately or may not  be corrected upon review.

## 2022-08-03 NOTE — Patient Instructions (Signed)
                                     Advice for Weight Management  -For most of us the best way to lose weight is by diet management. Generally speaking, diet management means consuming less calories intentionally which over time brings about progressive weight loss.  This can be achieved more effectively by avoiding ultra processed carbohydrates, processed meats, unhealthy fats.    It is critically important to know your numbers: how much calorie you are consuming and how much calorie you need. More importantly, our carbohydrates sources should be unprocessed naturally occurring  complex starch food items.  It is always important to balance nutrition also by  appropriate intake of proteins (mainly plant-based), healthy fats/oils, plenty of fruits and vegetables.   -The American College of Lifestyle Medicine (ACL M) recommends nutrition derived mostly from Whole Food, Plant Predominant Sources example an apple instead of applesauce or apple pie. Eat Plenty of vegetables, Mushrooms, fruits, Legumes, Whole Grains, Nuts, seeds in lieu of processed meats, processed snacks/pastries red meat, poultry, eggs.  Use only water or unsweetened tea for hydration.  The College also recommends the need to stay away from risky substances including alcohol, smoking; obtaining 7-9 hours of restorative sleep, at least 150 minutes of moderate intensity exercise weekly, importance of healthy social connections, and being mindful of stress and seek help when it is overwhelming.    -Sticking to a routine mealtime to eat 3 meals a day and avoiding unnecessary snacks is shown to have a big role in weight control. Under normal circumstances, the only time we burn stored energy is when we are hungry, so allow  some hunger to take place- hunger means no food between appropriate meal times, only water.  It is not advisable to starve.   -It is better to avoid simple carbohydrates including:  Cakes, Sweet Desserts, Ice Cream, Soda (diet and regular), Sweet Tea, Candies, Chips, Cookies, Store Bought Juices, Alcohol in Excess of  1-2 drinks a day, Lemonade,  Artificial Sweeteners, Doughnuts, Coffee Creamers, "Sugar-free" Products, etc, etc.  This is not a complete list.....    -Consulting with certified diabetes educators is proven to provide you with the most accurate and current information on diet.  Also, you may be  interested in discussing diet options/exchanges , we can schedule a visit with Gloria Webb, RDN, CDE for individualized nutrition education.  -Exercise: If you are able: 30 -60 minutes a day ,4 days a week, or 150 minutes of moderate intensity exercise weekly.    The longer the better if tolerated.  Combine stretch, strength, and aerobic activities.  If you were told in the past that you have high risk for cardiovascular diseases, or if you are currently symptomatic, you may seek evaluation by your heart doctor prior to initiating moderate to intense exercise programs.                                  Additional Care Considerations for Diabetes/Prediabetes   -Diabetes  is a chronic disease.  The most important care consideration is regular follow-up with your diabetes care provider with the goal being avoiding or delaying its complications and to take advantage of advances in medications and technology.  If appropriate actions are taken early enough, type 2 diabetes can even be   reversed.  Seek information from the right source.  - Whole Food, Plant Predominant Nutrition is highly recommended: Eat Plenty of vegetables, Mushrooms, fruits, Legumes, Whole Grains, Nuts, seeds in lieu of processed meats, processed snacks/pastries red meat, poultry, eggs as recommended by American College of  Lifestyle Medicine (ACLM).  -Type 2 diabetes is known to coexist with other important comorbidities such as high blood pressure and high cholesterol.  It is critical to control not only the  diabetes but also the high blood pressure and high cholesterol to minimize and delay the risk of complications including coronary artery disease, stroke, amputations, blindness, etc.  The good news is that this diet recommendation for type 2 diabetes is also very helpful for managing high cholesterol and high blood blood pressure.  - Studies showed that people with diabetes will benefit from a class of medications known as ACE inhibitors and statins.  Unless there are specific reasons not to be on these medications, the standard of care is to consider getting one from these groups of medications at an optimal doses.  These medications are generally considered safe and proven to help protect the heart and the kidneys.    - People with diabetes are encouraged to initiate and maintain regular follow-up with eye doctors, foot doctors, dentists , and if necessary heart and kidney doctors.     - It is highly recommended that people with diabetes quit smoking or stay away from smoking, and get yearly  flu vaccine and pneumonia vaccine at least every 5 years.  See above for additional recommendations on exercise, sleep, stress management , and healthy social connections.      

## 2022-11-06 ENCOUNTER — Telehealth: Payer: Self-pay | Admitting: "Endocrinology

## 2022-11-06 NOTE — Telephone Encounter (Signed)
Labs need to be updated due to having to move appt

## 2022-11-07 ENCOUNTER — Other Ambulatory Visit: Payer: Self-pay | Admitting: *Deleted

## 2022-11-07 DIAGNOSIS — E1165 Type 2 diabetes mellitus with hyperglycemia: Secondary | ICD-10-CM

## 2022-11-07 DIAGNOSIS — E0590 Thyrotoxicosis, unspecified without thyrotoxic crisis or storm: Secondary | ICD-10-CM

## 2022-11-07 DIAGNOSIS — E782 Mixed hyperlipidemia: Secondary | ICD-10-CM

## 2022-11-07 DIAGNOSIS — E0520 Thyrotoxicosis with toxic multinodular goiter without thyrotoxic crisis or storm: Secondary | ICD-10-CM

## 2022-11-07 NOTE — Telephone Encounter (Signed)
Labs have been updated . 

## 2022-11-08 ENCOUNTER — Ambulatory Visit: Payer: Medicare Other | Admitting: "Endocrinology

## 2023-03-07 ENCOUNTER — Ambulatory Visit: Payer: Medicare Other | Admitting: "Endocrinology

## 2023-04-04 ENCOUNTER — Other Ambulatory Visit (HOSPITAL_COMMUNITY)
Admission: RE | Admit: 2023-04-04 | Discharge: 2023-04-04 | Disposition: A | Discharge status: Home / Self Care | Payer: Medicare Other | Source: Ambulatory Visit | Attending: "Endocrinology | Admitting: "Endocrinology

## 2023-04-04 DIAGNOSIS — E782 Mixed hyperlipidemia: Secondary | ICD-10-CM | POA: Insufficient documentation

## 2023-04-04 DIAGNOSIS — E0520 Thyrotoxicosis with toxic multinodular goiter without thyrotoxic crisis or storm: Secondary | ICD-10-CM | POA: Diagnosis present

## 2023-04-04 DIAGNOSIS — E1165 Type 2 diabetes mellitus with hyperglycemia: Secondary | ICD-10-CM | POA: Diagnosis present

## 2023-04-04 DIAGNOSIS — E0590 Thyrotoxicosis, unspecified without thyrotoxic crisis or storm: Secondary | ICD-10-CM | POA: Diagnosis not present

## 2023-04-04 LAB — LIPID PANEL
Cholesterol: 135 mg/dL (ref 0–200)
HDL: 43 mg/dL (ref >40)
LDL Cholesterol: 82 mg/dL (ref 0–99)
Total CHOL/HDL Ratio: 3.1 ratio
Triglycerides: 50 mg/dL (ref <150)
VLDL: 10 mg/dL (ref 0–40)

## 2023-04-04 LAB — T4, FREE: Free T4: 1.04 ng/dL (ref 0.61–1.12)

## 2023-04-04 LAB — TSH: TSH: 1.987 u[IU]/mL (ref 0.350–4.500)

## 2023-04-18 ENCOUNTER — Telehealth: Payer: Self-pay | Admitting: "Endocrinology

## 2023-04-18 NOTE — Telephone Encounter (Signed)
Pt scheduled for tomorrow at 1130

## 2023-04-19 ENCOUNTER — Encounter: Payer: Self-pay | Admitting: "Endocrinology

## 2023-04-19 ENCOUNTER — Ambulatory Visit (INDEPENDENT_AMBULATORY_CARE_PROVIDER_SITE_OTHER): Admitting: "Endocrinology

## 2023-04-19 VITALS — BP 128/74 | HR 64 | Ht 63.0 in | Wt 155.8 lb

## 2023-04-19 DIAGNOSIS — E782 Mixed hyperlipidemia: Secondary | ICD-10-CM

## 2023-04-19 DIAGNOSIS — I10 Essential (primary) hypertension: Secondary | ICD-10-CM

## 2023-04-19 DIAGNOSIS — E1122 Type 2 diabetes mellitus with diabetic chronic kidney disease: Secondary | ICD-10-CM | POA: Diagnosis not present

## 2023-04-19 DIAGNOSIS — E0520 Thyrotoxicosis with toxic multinodular goiter without thyrotoxic crisis or storm: Secondary | ICD-10-CM | POA: Diagnosis not present

## 2023-04-19 DIAGNOSIS — N184 Chronic kidney disease, stage 4 (severe): Secondary | ICD-10-CM

## 2023-04-19 DIAGNOSIS — E1165 Type 2 diabetes mellitus with hyperglycemia: Secondary | ICD-10-CM | POA: Diagnosis not present

## 2023-04-19 DIAGNOSIS — Z794 Long term (current) use of insulin: Secondary | ICD-10-CM

## 2023-04-19 LAB — POCT GLYCOSYLATED HEMOGLOBIN (HGB A1C)

## 2023-04-19 MED ORDER — NOVOLOG MIX 70/30 FLEXPEN (70-30) 100 UNIT/ML ~~LOC~~ SUPN: 12.0000 [IU] | PEN_INJECTOR | Freq: Two times a day (BID) | SUBCUTANEOUS | 1 refills | Status: DC

## 2023-04-19 NOTE — Progress Notes (Signed)
 04/19/2023, 1:53 PM   Endocrinology follow-up note  Subjective:    Patient ID: Gloria Webb, female    DOB: 06/26/1950.  Gloria Webb is being seen in follow-up after she was seen in consultation for management of type 2 diabetes, toxic multinodular goiter, hyperlipidemia, hypertension.    PCP: Jaclynn Guarneri, FNP.   Past Medical History:  Diagnosis Date   Diabetes mellitus, type II (HCC)    Hypertension    Thyroid disease     Past Surgical History:  Procedure Laterality Date   broken ankle      Social History   Socioeconomic History   Marital status: Divorced    Spouse name: Not on file   Number of children: Not on file   Years of education: Not on file   Highest education level: Not on file  Occupational History   Not on file  Tobacco Use   Smoking status: Former    Types: Cigarettes   Smokeless tobacco: Not on file  Vaping Use   Vaping status: Never Used  Substance and Sexual Activity   Alcohol use: Not Currently   Drug use: Not Currently   Sexual activity: Not on file  Other Topics Concern   Not on file  Social History Narrative   Not on file   Social Drivers of Health   Financial Resource Strain: Not on file  Food Insecurity: Not on file  Transportation Needs: Not on file  Physical Activity: Not on file  Stress: Not on file  Social Connections: Not on file    Family History  Problem Relation Age of Onset   Hypertension Mother     Outpatient Encounter Medications as of 04/19/2023  Medication Sig   AMLODIPINE BESYLATE PO Take 5 mg by mouth daily.   atorvastatin (LIPITOR) 20 MG tablet Take 20 mg by mouth daily.   Blood Glucose Monitoring Suppl (ACCU-CHEK GUIDE ME) w/Device KIT 1 Piece by Does not apply route as directed.   calcitRIOL (ROCALTROL) 0.25 MCG capsule Take 0.25 mcg by mouth daily.   carvedilol (COREG) 25 MG tablet Take 25 mg by mouth 2 (two) times daily.    cetirizine (ZYRTEC) 10 MG tablet Take 10 mg by mouth daily.   glucose blood (ACCU-CHEK GUIDE) test strip Use as instructed   hydrALAZINE (APRESOLINE) 50 MG tablet Take 100 mg by mouth 2 (two) times daily.   insulin aspart protamine - aspart (NOVOLOG MIX 70/30 FLEXPEN) (70-30) 100 UNIT/ML FlexPen Inject 12-14 Units into the skin 2 (two) times daily with a meal. 14 units with breakfast and 12 units with supper   losartan (COZAAR) 100 MG tablet Take 100 mg by mouth daily.   ondansetron (ZOFRAN) 4 MG tablet Take 4 mg by mouth every 8 (eight) hours as needed for nausea or vomiting.   Vitamin D, Ergocalciferol, (DRISDOL) 1.25 MG (50000 UNIT) CAPS capsule Take 50,000 Units by mouth every 14 (fourteen) days.   [DISCONTINUED] insulin aspart protamine - aspart (NOVOLOG MIX 70/30 FLEXPEN) (70-30) 100 UNIT/ML FlexPen Inject 16 Units into the skin 2 (two) times daily with a meal. (Patient taking differently: Inject 12-16 Units into the skin 2 (two) times daily with a meal.)   No  facility-administered encounter medications on file as of 04/19/2023.    ALLERGIES: Allergies  Allergen Reactions   Methimazole Swelling    Pt states she experienced swelling of her face and tongue    VACCINATION STATUS:  There is no immunization history on file for this patient.  Diabetes She presents for her follow-up diabetic visit. She has type 2 diabetes mellitus. Onset time: She was diagnosed at approximate age of 35 years. Her disease course has been improving. There are no hypoglycemic associated symptoms. Pertinent negatives for hypoglycemia include no confusion, headaches, pallor or seizures. Pertinent negatives for diabetes include no chest pain, no fatigue, no polydipsia, no polyphagia and no polyuria. There are no hypoglycemic complications. Symptoms are stable. Diabetic complications include nephropathy. Risk factors for coronary artery disease include diabetes mellitus, dyslipidemia, family history, obesity,  hypertension, sedentary lifestyle and post-menopausal. Current diabetic treatment includes insulin injections. Her weight is fluctuating minimally. She is following a generally unhealthy diet. When asked about meal planning, she reported none. She has not had a previous visit with a dietitian. She never participates in exercise. Her home blood glucose trend is decreasing steadily. Her breakfast blood glucose range is generally 130-140 mg/dl. Her lunch blood glucose range is generally 130-140 mg/dl. Her dinner blood glucose range is generally 130-140 mg/dl. Her bedtime blood glucose range is generally 130-140 mg/dl. Her overall blood glucose range is 130-140 mg/dl. (She presents with stable and improving glycemic profile.  Her AGP report shows 87% time range, 9% level 1 hyperglycemia, 1% level 2-she has 3% hypoglycemia mostly nocturnal.  Her point-of-care A1c is 6.7% generally improving.) An ACE inhibitor/angiotensin II receptor blocker is being taken. Eye exam is current.  Hyperlipidemia This is a chronic problem. The current episode started more than 1 year ago. The problem is controlled. Exacerbating diseases include chronic renal disease, diabetes and obesity. Pertinent negatives include no chest pain, myalgias or shortness of breath. Current antihyperlipidemic treatment includes statins. Risk factors for coronary artery disease include diabetes mellitus, hypertension, obesity, a sedentary lifestyle, post-menopausal and family history.  Hypertension This is a chronic problem. The current episode started more than 1 year ago. Pertinent negatives include no chest pain, headaches, palpitations or shortness of breath. Risk factors for coronary artery disease include dyslipidemia, diabetes mellitus, obesity, sedentary lifestyle, family history and post-menopausal state. Past treatments include angiotensin blockers, beta blockers, calcium channel blockers and direct vasodilators. Identifiable causes of hypertension  include chronic renal disease.     Review of Systems  Constitutional:  Negative for chills, fatigue, fever and unexpected weight change.  HENT:  Negative for trouble swallowing and voice change.   Eyes:  Negative for visual disturbance.  Respiratory:  Negative for cough, shortness of breath and wheezing.   Cardiovascular:  Negative for chest pain, palpitations and leg swelling.  Gastrointestinal:  Negative for diarrhea, nausea and vomiting.  Endocrine: Negative for cold intolerance, heat intolerance, polydipsia, polyphagia and polyuria.  Musculoskeletal:  Negative for arthralgias and myalgias.  Skin:  Negative for color change, pallor, rash and wound.  Neurological:  Negative for seizures and headaches.  Psychiatric/Behavioral:  Negative for confusion and suicidal ideas.     Objective:       04/19/2023   10:43 AM 08/03/2022    3:24 PM 05/01/2022    2:02 PM  Vitals with BMI  Height 5\' 3"  5\' 3"  5\' 3"   Weight 155 lbs 13 oz 160 lbs 6 oz 158 lbs 13 oz  BMI 27.61 28.42 28.14  Systolic 128 110 782  Diastolic 74 64 72  Pulse 64 64 56    BP 128/74   Pulse 64   Ht 5\' 3"  (1.6 m)   Wt 155 lb 12.8 oz (70.7 kg)   BMI 27.60 kg/m   Wt Readings from Last 3 Encounters:  04/19/23 155 lb 12.8 oz (70.7 kg)  08/03/22 160 lb 6.4 oz (72.8 kg)  05/01/22 158 lb 12.8 oz (72 kg)       CMP ( most recent) CMP     Component Value Date/Time   NA 140 04/11/2022 1156   NA 142 04/05/2021 0000   K 4.2 04/11/2022 1156   CL 102 04/11/2022 1156   CO2 25 04/11/2022 1156   GLUCOSE 255 (H) 04/11/2022 1156   BUN 57 (H) 04/11/2022 1156   BUN 36 (A) 04/05/2021 0000   CREATININE 2.66 (H) 04/11/2022 1156   CALCIUM 9.6 04/11/2022 1156   PROT 7.3 04/11/2022 1156   ALBUMIN 3.3 (L) 04/11/2022 1156   AST 21 04/11/2022 1156   ALT 15 04/11/2022 1156   ALKPHOS 72 04/11/2022 1156   BILITOT 0.8 04/11/2022 1156   GFRNONAA 19 (L) 04/11/2022 1156   GFRAA 32 06/27/2019 0000     Diabetic Labs (most  recent): Lab Results  Component Value Date   HGBA1C 6.9 08/03/2022   HGBA1C 6.7 05/01/2022   HGBA1C 7.1 (A) 01/18/2022   MICROALBUR 150 05/20/2020     Lipid Panel ( most recent) Lipid Panel     Component Value Date/Time   CHOL 135 04/04/2023 0931   TRIG 50 04/04/2023 0931   HDL 43 04/04/2023 0931   CHOLHDL 3.1 04/04/2023 0931   VLDL 10 04/04/2023 0931   LDLCALC 82 04/04/2023 0931      Lab Results  Component Value Date   TSH 1.987 04/04/2023   TSH 1.780 07/25/2022   TSH 1.871 04/11/2022   TSH 1.755 01/09/2022   TSH 0.043 (L) 11/04/2021   TSH 0.010 (L) 09/06/2021   TSH 0.38 (A) 04/05/2021   TSH 0.362 01/18/2021   TSH 0.05 (A) 11/26/2020   TSH 0.265 (L) 12/30/2019   FREET4 1.04 04/04/2023   FREET4 1.08 07/25/2022   FREET4 1.27 (H) 04/11/2022   FREET4 0.96 01/09/2022   FREET4 1.13 (H) 11/04/2021   FREET4 1.45 (H) 09/06/2021   FREET4 1.03 01/18/2021   FREET4 1.01 12/30/2019   FREET4 1.72 10/09/2019      Assessment & Plan:   1. 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  She presents with stable and improving glycemic profile.  Her AGP report shows 87% time range, 9% level 1 hyperglycemia, 1% level 2-she has 3% hypoglycemia mostly nocturnal.  Her point-of-care A1c is 6.7% generally improving.   Recent labs reviewed. - I had a long discussion with her about the progressive nature of diabetes and the pathology behind its complications. -her diabetes is complicated by CKD, obesity/sedentary life and she remains at a high risk for more acute and chronic complications which include CAD, CVA, CKD, retinopathy, and neuropathy. These are all discussed in detail with her.  - I have counseled her on diet  and weight management  by adopting a carbohydrate restricted/protein rich diet. Patient is encouraged to switch to  unprocessed or minimally processed     complex starch  and increased protein intake (animal or plant source), fruits, and vegetables. -  she is advised to stick  to a routine mealtimes to eat 3 meals  a day and avoid unnecessary snacks ( to snack only to correct hypoglycemia).   - she acknowledges that there is a room for improvement in her food and drink choices. - Suggestion is made for her to avoid simple carbohydrates  from her diet including Cakes, Sweet Desserts, Ice Cream, Soda (diet and regular), Sweet Tea, Candies, Chips, Cookies, Store Bought Juices, Alcohol in Excess of  1-2 drinks a day, Artificial Sweeteners,  Coffee Creamer, and "Sugar-free" Products, Lemonade. This will help patient to have more stable blood glucose profile and potentially avoid unintended weight gain.  - she will be scheduled with Norm Salt, RDN, CDE for diabetes education.  - I have approached her with the following individualized plan to manage  her diabetes and patient agrees:   -She presents with continued improvement in her glycemic profile.  She is responding to the premixed insulin.  She is advised to continue NovoLog 70/30 at 14 units with breakfast and 12 units with supper  for Premeal blood glucose readings above 90 mg per DL.    She is benefiting from her CGM, advised to continue to wear her CGM at all times. -She is encouraged to call clinic for hypoglycemia below 70 or hyperglycemia above 200 mg per DL x 3.   - Specific targets for  A1c;  LDL, HDL,  and Triglycerides were discussed with the patient.  2) Blood Pressure /Hypertension:  Her blood pressure is controlled to target. she is advised to continue her current medications including losartan 25 mg p.o. daily, carvedilol 25 mg p.o. twice daily, hydralazine 50 mg p.o. twice daily.  She was previously supplemented with low-dose potassium.   3) Lipids/Hyperlipidemia:   Review of her recent lipid panel showed improved LDL at 82, from 94.      she  is advised to continue atorvastatin 20 mg p.o.  daily at bedtime.     She will be considered for fasting lipid panel at subsequent visits.    -  Side effects and precautions discussed with her.  4)  Weight/Diet:  Body mass index is 27.6 kg/m.  -     she is  a candidate for weight loss. I discussed with her the fact that loss of 5 - 10% of her  current body weight will have the most impact on her diabetes management.  Exercise, and detailed carbohydrates information provided  -  detailed on discharge instructions.  She is encouraged to stay close to her nephrologist, PMD and other providers.  5) toxic multinodular goiter: -  She underwent bilateral fine-needle aspiration of nodules on both lobes of the thyroid with benign findings.  Was previously treated with methimazole with incomplete response.  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  She received this treatment on December 01, 2021.  She remains to euthyroid.  She is advised to report any compressive symptoms.  Her next option  will be  treating large multinodular goiter    6) Chronic Care/Health Maintenance:  -she  is on ACEI/ARB and Statin medications and  is encouraged to initiate and continue to follow up with Ophthalmology, Dentist,  Podiatrist at least yearly or according to recommendations, and advised to   stay away from smoking. I have recommended yearly flu vaccine and pneumonia vaccine at least every 5 years; moderate intensity exercise for up to 150 minutes weekly; and  sleep for  at least 7 hours a day.  She did have normal screening point-of-care ABI in December 2022.   This study will be repeated in December 2026, or sooner if needed.  - she is  advised to maintain close follow up with Jaclynn Guarneri, FNP for primary care needs, as well as her other providers for optimal and coordinated care.   I spent  26  minutes in the care of the patient today including review of  labs from CMP, Lipids, Thyroid Function, Hematology (current and previous including abstractions from other facilities); face-to-face time discussing  her blood glucose readings/logs, discussing hypoglycemia and hyperglycemia episodes and symptoms, medications doses, her options of short and long term treatment based on the latest standards of care / guidelines;  discussion about incorporating lifestyle medicine;  and documenting the encounter. Risk reduction counseling performed per USPSTF guidelines to reduce  cardiovascular risk factors.     Please refer to Patient Instructions for Blood Glucose Monitoring and Insulin/Medications Dosing Guide"  in media tab for additional information. Please  also refer to " Patient Self Inventory" in the Media  tab for reviewed elements of pertinent patient history.  Vira Agar participated in the discussions, expressed understanding, and voiced agreement with the above plans.  All questions were answered to her satisfaction. she is encouraged to contact clinic should she have any questions or concerns prior to her return visit.     Follow up plan: - Return in about 6 months (around 10/20/2023) for F/U with Pre-visit Labs, Meter/CGM/Logs, A1c here.  Marquis Lunch, MD [facility] [address] [place], [place] [address] Phone: [phone]  Fax: [phone]    04/19/2023, 1:53 PM  This note was partially dictated with voice recognition software. Similar sounding words can be transcribed inadequately or may not  be corrected upon review.

## 2023-04-19 NOTE — Patient Instructions (Signed)

## 2023-05-21 ENCOUNTER — Telehealth: Payer: Self-pay | Admitting: "Endocrinology

## 2023-05-21 NOTE — Telephone Encounter (Signed)
 Pt left a VM on nurse line about having medical records sent to Dr Quentin Brunner. I tried to call pt back. Pt has to either sign a release with them or come here to sign one for us . Pt did not answer

## 2023-06-07 IMAGING — CT CT ABDOMEN W/O CM
2 of 4 series · 15 of 46 positions shown, 17 images · non-contrast
Comparison: 03/26/2018

CLINICAL DATA: History of chronic pancreatitis. Intermittent nausea
and vomiting for 15 years.

EXAM:
CT ABDOMEN WITHOUT CONTRAST
TECHNIQUE: Multidetector CT imaging of the abdomen was performed following the
standard protocol without IV contrast.
RADIATION DOSE REDUCTION: This exam was performed according to the
departmental dose-optimization program which includes automated
exposure control, adjustment of the mA and/or kV according to
patient size and/or use of iterative reconstruction technique.

[Series 2: axial st · axial · 0.81mm/px · z∈[+1169,+1364]mm · 12 of 45 slices shown, 14 images]
[im 3/45  soft-tissue]
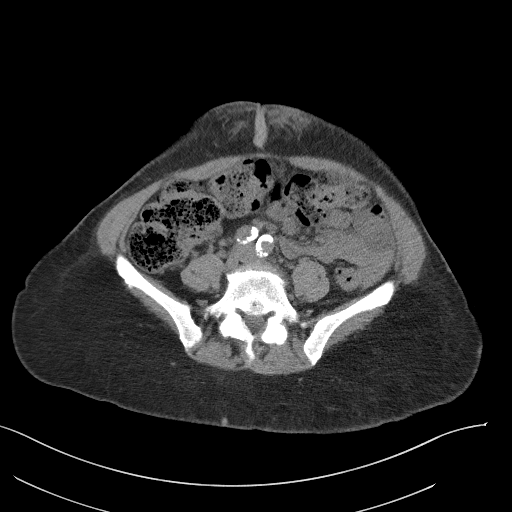
[im 3/45  bone]
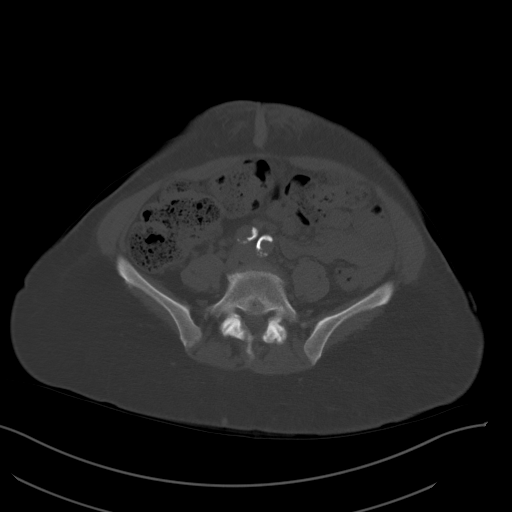
[im 8/45  soft-tissue]
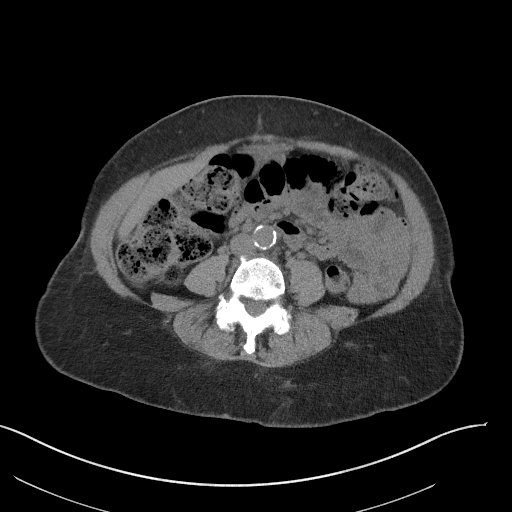
[im 11/45  soft-tissue]
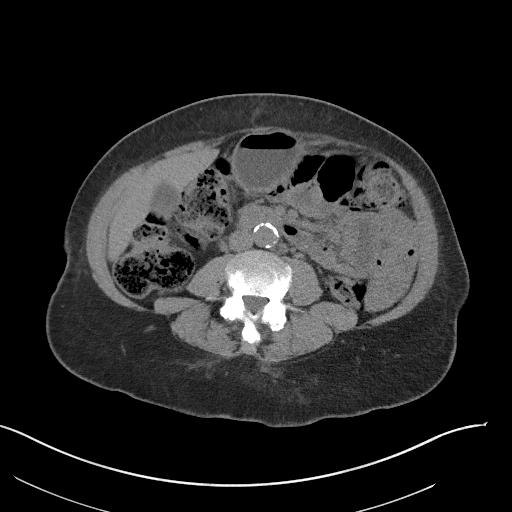
[im 13/45  soft-tissue]
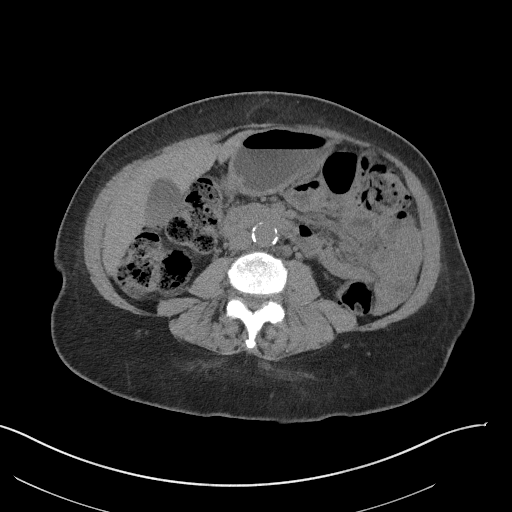
[im 19/45  soft-tissue]
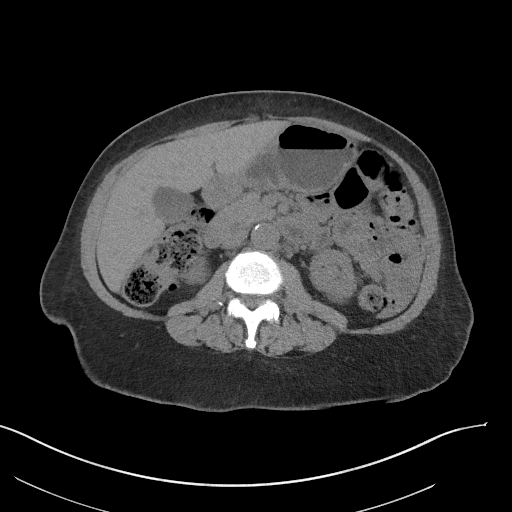
[im 21/45  soft-tissue]
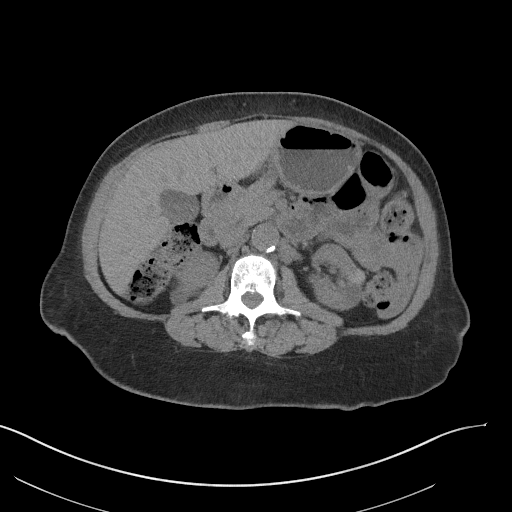
[im 24/45  soft-tissue]
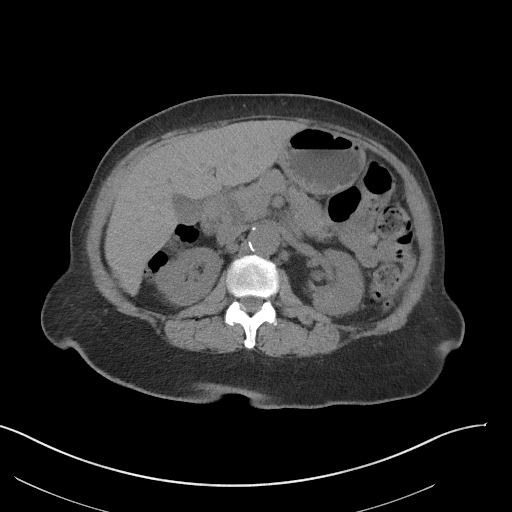
[im 29/45  soft-tissue]
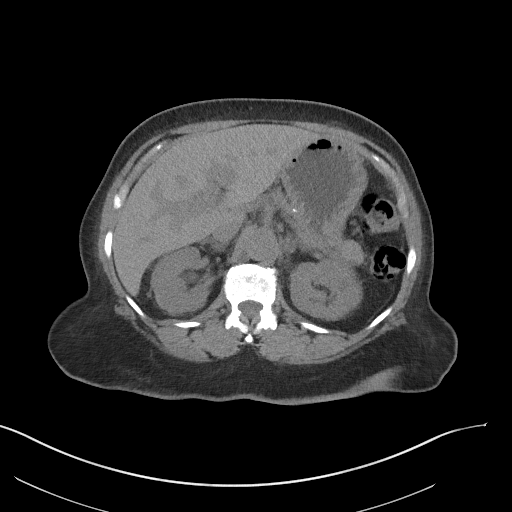
[im 32/45  soft-tissue]
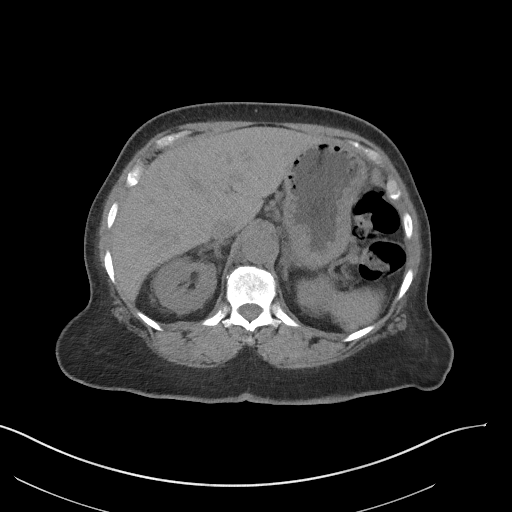
[im 32/45  bone]
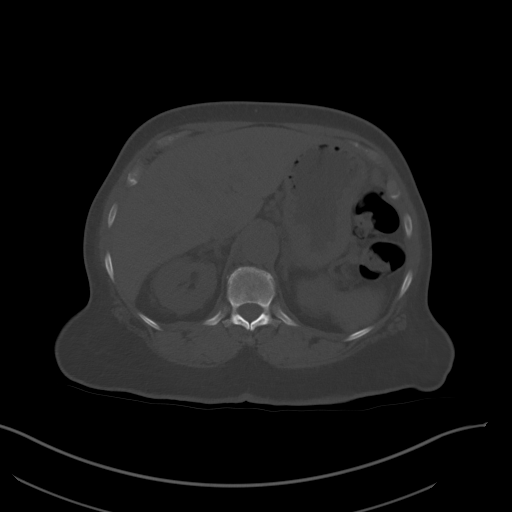
[im 34/45  soft-tissue]
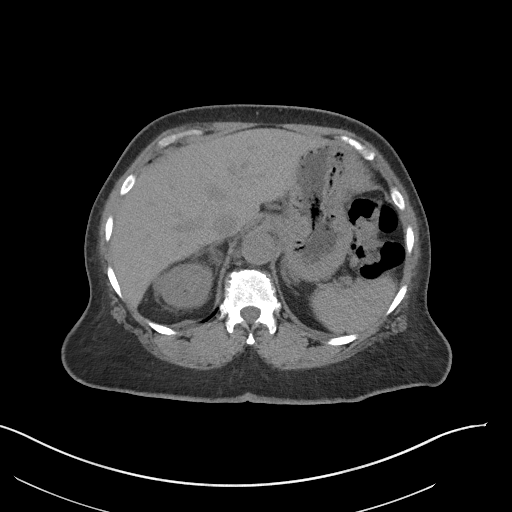
[im 39/45  soft-tissue]
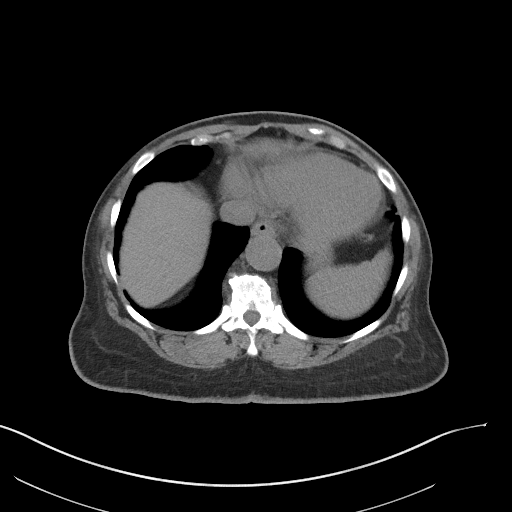
[im 42/45  soft-tissue]
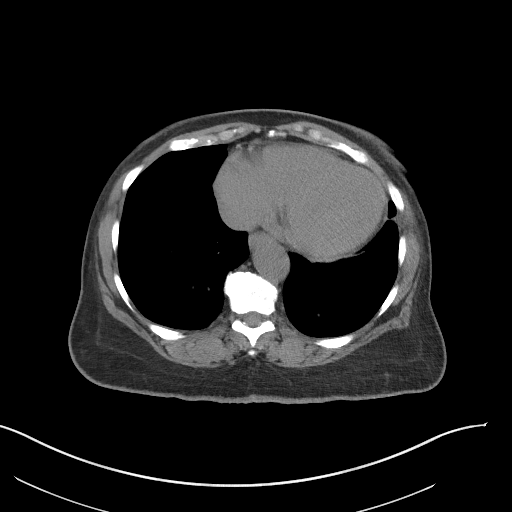

[Series 5: coronal st · coronal · 0.46mm/px · 3 of 103 slices shown]
[im 35/103  soft-tissue]
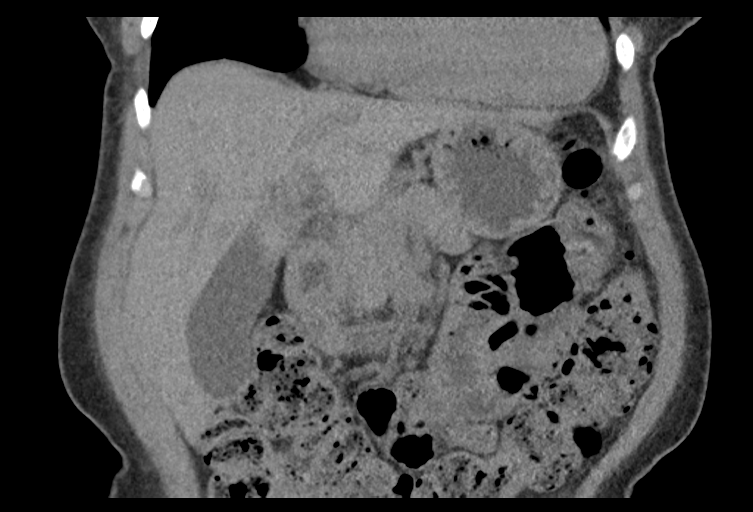
[im 46/103  soft-tissue]
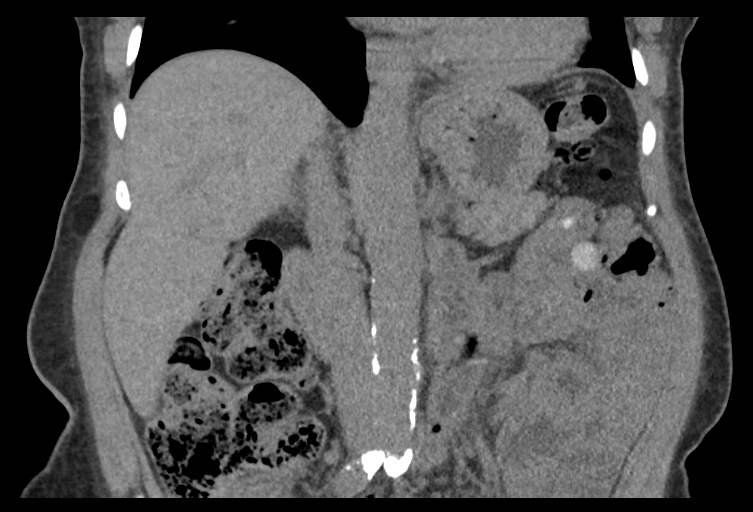
[im 57/103  soft-tissue]
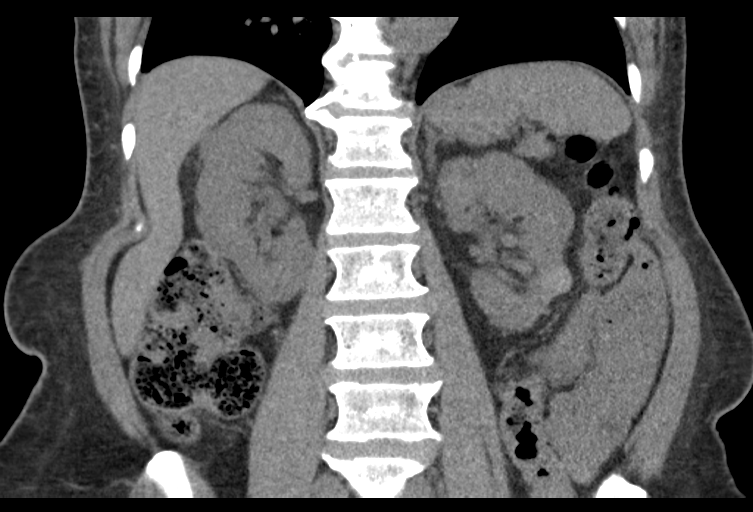

[15 of 46 positions shown; findings below may reference images not displayed]

FINDINGS: Lower chest: Lung bases are clear.  Mild cardiac enlargement.

Hepatobiliary: No focal liver abnormality is seen. No gallstones,
gallbladder wall thickening, or biliary dilatation.

Pancreas: Unremarkable. No pancreatic ductal dilatation or
surrounding inflammatory changes.

Spleen: Normal in size without focal abnormality.

Adrenals/Urinary Tract: Subcentimeter bilateral adrenal gland
nodules are likely benign by size criteria. Focal lesions in both
kidneys, largest on the left measuring 1.5 cm diameter. Density
measurements are consistent with cysts. No follow-up is indicated.
Mildly hyperdense lesion on the left kidney measuring 1.1 cm
diameter is likely hemorrhagic cyst. No follow-up is indicated. No
hydronephrosis or hydroureter. No renal stones identified.

Stomach/Bowel: Stomach and visualized small and large bowel are not
abnormally distended. Stool fills the visualized colon. No
inflammatory changes are identified.

Vascular/Lymphatic: Aortic atherosclerosis. No enlarged abdominal
lymph nodes.

Other: No free air or free fluid in the abdomen. Tiny anterior
abdominal wall hernia at the midline containing fat with fluid or
stranding suggesting fat necrosis.

Musculoskeletal: Normal alignment of the visualized lumbar spine.
Mild degenerative changes. No acute displaced fractures identified.
IMPRESSION: 1. Unenhanced appearance of the pancreas is unremarkable. No
inflammatory changes or collections are identified.
2. Bilateral renal cysts, including hemorrhagic cyst on the left. No
follow-up is indicated.
3. Subcentimeter renal gland nodules are likely benign. No follow-up
is indicated.
4. Aortic atherosclerosis.
5. No acute process demonstrated.

## 2023-09-06 LAB — LIPID PANEL
Cholesterol: 134 (ref 0–200)
HDL: 46 (ref 35–70)
LDL Cholesterol: 75
Triglycerides: 64 (ref 40–160)

## 2023-09-06 LAB — TSH: TSH: 0.98 (ref 0.41–5.90)

## 2023-09-06 LAB — BASIC METABOLIC PANEL WITH GFR
BUN: 51 — AB (ref 4–21)
Creatinine: 3.7 — AB (ref 0.5–1.1)

## 2023-09-06 LAB — CBC AND DIFFERENTIAL
HCT: 34 — AB (ref 36–46)
Hemoglobin: 10.4 — AB (ref 12.0–16.0)

## 2023-09-06 LAB — COMPREHENSIVE METABOLIC PANEL WITH GFR: eGFR: 12

## 2023-09-06 LAB — VITAMIN D 25 HYDROXY (VIT D DEFICIENCY, FRACTURES): Vit D, 25-Hydroxy: 84.8

## 2023-09-06 LAB — LAB REPORT - SCANNED: EGFR: 12

## 2023-10-22 ENCOUNTER — Ambulatory Visit: Admitting: "Endocrinology

## 2023-10-23 ENCOUNTER — Ambulatory Visit (INDEPENDENT_AMBULATORY_CARE_PROVIDER_SITE_OTHER): Admitting: "Endocrinology

## 2023-10-23 ENCOUNTER — Encounter: Payer: Self-pay | Admitting: "Endocrinology

## 2023-10-23 VITALS — BP 128/76 | HR 72 | Ht 63.0 in | Wt 141.2 lb

## 2023-10-23 DIAGNOSIS — I10 Essential (primary) hypertension: Secondary | ICD-10-CM

## 2023-10-23 DIAGNOSIS — N184 Chronic kidney disease, stage 4 (severe): Secondary | ICD-10-CM | POA: Insufficient documentation

## 2023-10-23 DIAGNOSIS — E0520 Thyrotoxicosis with toxic multinodular goiter without thyrotoxic crisis or storm: Secondary | ICD-10-CM | POA: Diagnosis not present

## 2023-10-23 DIAGNOSIS — Z794 Long term (current) use of insulin: Secondary | ICD-10-CM | POA: Diagnosis not present

## 2023-10-23 DIAGNOSIS — E782 Mixed hyperlipidemia: Secondary | ICD-10-CM

## 2023-10-23 DIAGNOSIS — E1122 Type 2 diabetes mellitus with diabetic chronic kidney disease: Secondary | ICD-10-CM

## 2023-10-23 LAB — POCT GLYCOSYLATED HEMOGLOBIN (HGB A1C): HbA1c, POC (controlled diabetic range): 6.5 % (ref 0.0–7.0)

## 2023-10-23 MED ORDER — NOVOLOG MIX 70/30 FLEXPEN (70-30) 100 UNIT/ML ~~LOC~~ SUPN: 10.0000 [IU] | PEN_INJECTOR | Freq: Two times a day (BID) | SUBCUTANEOUS | 1 refills | Status: AC

## 2023-10-23 NOTE — Progress Notes (Signed)
 10/23/2023, 3:33 PM   Endocrinology follow-up note  Subjective:    Patient ID: Gloria Webb, female    DOB: 07/09/1950.  Ikhlas Albo is being seen in follow-up after she was seen in consultation for management of type 2 diabetes, toxic multinodular goiter, hyperlipidemia, hypertension.    PCP: Detra Lang BROCKS, FNP.   Past Medical History:  Diagnosis Date   Diabetes mellitus, type II (HCC)    Hypertension    Thyroid  disease     Past Surgical History:  Procedure Laterality Date   broken ankle      Social History   Socioeconomic History   Marital status: Divorced    Spouse name: Not on file   Number of children: Not on file   Years of education: Not on file   Highest education level: Not on file  Occupational History   Not on file  Tobacco Use   Smoking status: Former    Types: Cigarettes   Smokeless tobacco: Not on file  Vaping Use   Vaping status: Never Used  Substance and Sexual Activity   Alcohol use: Not Currently   Drug use: Not Currently   Sexual activity: Not on file  Other Topics Concern   Not on file  Social History Narrative   Not on file   Social Drivers of Health   Financial Resource Strain: Low Risk  (06/25/2023)   Received [facility]   Overall Financial Resource Strain (CARDIA)    Difficulty of Paying Living Expenses: Not hard at all  Food Insecurity: No Food Insecurity (06/25/2023)   Received from [facility]   Hunger Vital Sign    Within the past 12 months, you worried that your food would run out before you got the money to buy more.: Never true    Within the past 12 months, the food you bought just didn't last and you didn't have money to get more.: Never true  Transportation Needs: No Transportation Needs (06/25/2023)   Received from [facility] - Transportation    Lack of Transportation (Medical): No    Lack of Transportation  (Non-Medical): No  Physical Activity: Inactive (06/25/2023)   Received from [facility]   Exercise Vital Sign    On average, how many days per week do you engage in moderate to strenuous exercise (like a brisk walk)?: 0 days    On average, how many minutes do you engage in exercise at this level?: 0 min  Stress: Stress Concern Present (06/25/2023)   Received from [facility] - Occupational Stress Questionnaire    Feeling of Stress : To some extent  Social Connections: Socially Isolated (06/25/2023)   Received from [facility]   Social Connection and Isolation Panel    In a typical week, how many times do you talk on the phone with family, friends, or neighbors?: More than three times a week    How often do you get together with friends or relatives?: More than three times a week    How often do you attend church or religious services?: Never    Do you belong to any clubs or organizations such as  church groups, unions, fraternal or athletic groups, or school groups?: No    How often do you attend meetings of the clubs or organizations you belong to?: Never    Are you married, widowed, divorced, separated, never married, or living with a partner?: Divorced    Family History  Problem Relation Age of Onset   Hypertension Mother     Outpatient Encounter Medications as of 10/23/2023  Medication Sig   [DISCONTINUED] insulin aspart  protamine - aspart (NOVOLOG  MIX 70/30 FLEXPEN) (70-30) 100 UNIT/ML FlexPen Inject 12-14 Units into the skin 2 (two) times daily with a meal. 14 units with breakfast and 12 units with supper (Patient taking differently: Inject 10-14 Units into the skin 2 (two) times daily with a meal. 14 units with breakfast and 12 units with supper)   AMLODIPINE BESYLATE PO Take 5 mg by mouth daily.   atorvastatin (LIPITOR) 20 MG tablet Take 20 mg by mouth daily.   Blood Glucose Monitoring Suppl (ACCU-CHEK GUIDE ME) w/Device KIT 1  Piece by Does not apply route as directed.   calcitRIOL (ROCALTROL) 0.25 MCG capsule Take 0.25 mcg by mouth daily.   carvedilol (COREG) 25 MG tablet Take 25 mg by mouth 2 (two) times daily.   cetirizine (ZYRTEC) 10 MG tablet Take 10 mg by mouth daily.   glucose blood (ACCU-CHEK GUIDE) test strip Use as instructed   hydrALAZINE (APRESOLINE) 50 MG tablet Take 100 mg by mouth 2 (two) times daily.   insulin aspart  protamine - aspart (NOVOLOG  MIX 70/30 FLEXPEN) (70-30) 100 UNIT/ML FlexPen Inject 10 Units into the skin 2 (two) times daily with a meal.   losartan (COZAAR) 100 MG tablet Take 100 mg by mouth daily.   ondansetron (ZOFRAN) 4 MG tablet Take 4 mg by mouth every 8 (eight) hours as needed for nausea or vomiting.   Vitamin D , Ergocalciferol , (DRISDOL) 1.25 MG (50000 UNIT) CAPS capsule Take 50,000 Units by mouth every 14 (fourteen) days.   No facility-administered encounter medications on file as of 10/23/2023.    ALLERGIES: Allergies  Allergen Reactions   Methimazole Swelling    Pt states she experienced swelling of her face and tongue    VACCINATION STATUS: Immunization History  Administered Date(s) Administered   Moderna Sars-Covid-2 Vaccination 04/11/2019, 05/09/2019, 03/22/2020    Diabetes She presents for her follow-up diabetic visit. She has type 2 diabetes mellitus. Onset time: She was diagnosed at approximate age of 35 years. Her disease course has been improving. There are no hypoglycemic associated symptoms. Pertinent negatives for hypoglycemia include no confusion, headaches, pallor or seizures. Pertinent negatives for diabetes include no chest pain, no fatigue, no polydipsia, no polyphagia and no polyuria. There are no hypoglycemic complications. Symptoms are improving. Diabetic complications include nephropathy. Risk factors for coronary artery disease include diabetes mellitus, dyslipidemia, family history, obesity, hypertension, sedentary lifestyle and post-menopausal.  Current diabetic treatment includes insulin injections. Her weight is decreasing steadily. When asked about meal planning, she reported none. She has not had a previous visit with a dietitian. She never participates in exercise. Her home blood glucose trend is decreasing steadily. Her breakfast blood glucose range is generally 110-130 mg/dl. Her lunch blood glucose range is generally 110-130 mg/dl. Her dinner blood glucose range is generally 110-130 mg/dl. Her bedtime blood glucose range is generally 110-130 mg/dl. Her overall blood glucose range is 110-130 mg/dl. (She is now on hemodialysis.  She presents with tightly controlled glycemic profile.  Her AGP report shows 85% time in range, 4% mid 1  hyperglycemia.  She has 11% Level 1 hypoglycemia.  Her point-of-care A1c is 6.5%. ) An ACE inhibitor/angiotensin II receptor blocker is being taken. Eye exam is current.  Hyperlipidemia This is a chronic problem. The current episode started more than 1 year ago. The problem is controlled. Exacerbating diseases include chronic renal disease and diabetes. Pertinent negatives include no chest pain, myalgias or shortness of breath. Current antihyperlipidemic treatment includes statins. Risk factors for coronary artery disease include diabetes mellitus, hypertension, obesity, a sedentary lifestyle, post-menopausal and family history.  Hypertension This is a chronic problem. The current episode started more than 1 year ago. Pertinent negatives include no chest pain, headaches, palpitations or shortness of breath. Risk factors for coronary artery disease include dyslipidemia, diabetes mellitus, obesity, sedentary lifestyle, family history and post-menopausal state. Past treatments include angiotensin blockers, beta blockers, calcium channel blockers and direct vasodilators. Identifiable causes of hypertension include chronic renal disease.     Objective:       10/23/2023    2:34 PM 04/19/2023   10:43 AM 08/03/2022     3:24 PM  Vitals with BMI  Height 5' 3 5' 3 5' 3  Weight 141 lbs 3 oz 155 lbs 13 oz 160 lbs 6 oz  BMI 25.02 27.61 28.42  Systolic 128 128 889  Diastolic 76 74 64  Pulse 72 64 64    BP 128/76   Pulse 72   Ht 5' 3 (1.6 m)   Wt 141 lb 3.2 oz (64 kg)   BMI 25.01 kg/m   Wt Readings from Last 3 Encounters:  10/23/23 141 lb 3.2 oz (64 kg)  04/19/23 155 lb 12.8 oz (70.7 kg)  08/03/22 160 lb 6.4 oz (72.8 kg)       CMP ( most recent) CMP     Component Value Date/Time   NA 140 04/11/2022 1156   NA 142 04/05/2021 0000   K 4.2 04/11/2022 1156   CL 102 04/11/2022 1156   CO2 25 04/11/2022 1156   GLUCOSE 255 (H) 04/11/2022 1156   BUN 51 (A) 09/06/2023 0000   CREATININE 3.7 (A) 09/06/2023 0000   CREATININE 2.66 (H) 04/11/2022 1156   CALCIUM 9.6 04/11/2022 1156   PROT 7.3 04/11/2022 1156   ALBUMIN 3.3 (L) 04/11/2022 1156   AST 21 04/11/2022 1156   ALT 15 04/11/2022 1156   ALKPHOS 72 04/11/2022 1156   BILITOT 0.8 04/11/2022 1156   GFRNONAA 19 (L) 04/11/2022 1156   GFRAA 32 06/27/2019 0000     Diabetic Labs (most recent): Lab Results  Component Value Date   HGBA1C 6.9 08/03/2022   HGBA1C 6.7 05/01/2022   HGBA1C 7.1 (A) 01/18/2022   MICROALBUR 150 05/20/2020     Lipid Panel ( most recent) Lipid Panel     Component Value Date/Time   CHOL 134 09/06/2023 0000   TRIG 64 09/06/2023 0000   HDL 46 09/06/2023 0000   CHOLHDL 3.1 04/04/2023 0931   VLDL 10 04/04/2023 0931   LDLCALC 75 09/06/2023 0000      Lab Results  Component Value Date   TSH 0.98 09/06/2023   TSH 1.987 04/04/2023   TSH 1.780 07/25/2022   TSH 1.871 04/11/2022   TSH 1.755 01/09/2022   TSH 0.043 (L) 11/04/2021   TSH 0.010 (L) 09/06/2021   TSH 0.38 (A) 04/05/2021   TSH 0.362 01/18/2021   TSH 0.05 (A) 11/26/2020   FREET4 1.04 04/04/2023   FREET4 1.08 07/25/2022   FREET4 1.27 (H) 04/11/2022   FREET4 0.96  01/09/2022   FREET4 1.13 (H) 11/04/2021   FREET4 1.45 (H) 09/06/2021   FREET4 1.03  01/18/2021   FREET4 1.01 12/30/2019   FREET4 1.72 10/09/2019      Assessment & Plan:   1. 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  She is now on hemodialysis.  She presents with tightly controlled glycemic profile.  Her AGP report shows 85% time in range, 4% mid 1 hyperglycemia.  She has 11% Level 1 hypoglycemia.  Her point-of-care A1c is 6.5%.    Recent labs reviewed. - I had a long discussion with her about the progressive nature of diabetes and the pathology behind its complications. -her diabetes is complicated by CKD, obesity/sedentary life and she remains at a high risk for more acute and chronic complications which include CAD, CVA, CKD, retinopathy, and neuropathy. These are all discussed in detail with her.  - I have counseled her on diet  and weight management  by adopting a carbohydrate restricted/protein rich diet. Patient is encouraged to switch to  unprocessed or minimally processed     complex starch and increased protein intake (animal or plant source), fruits, and vegetables. -  she is advised to stick to a routine mealtimes to eat 3 meals  a day and avoid unnecessary snacks ( to snack only to correct hypoglycemia).   - she acknowledges that there is a room for improvement in her food and drink choices. - Suggestion is made for her to avoid simple carbohydrates  from her diet including Cakes, Sweet Desserts, Ice Cream, Soda (diet and regular), Sweet Tea, Candies, Chips, Cookies, Store Bought Juices, Alcohol in Excess of  1-2 drinks a day, Artificial Sweeteners,  Coffee Creamer, and Sugar-free Products, Lemonade. This will help patient to have more stable blood glucose profile and potentially avoid unintended weight gain.   - I have approached her with the following individualized plan to manage  her diabetes and patient agrees:   -She presents with continued  improvement in her glycemic profile.  She is now on hemodialysis, continued on her premixed insulin.   - Priority would be to avoid hypoglycemia in this patient.  She is advised to lower her NovoLog  70/30 to 10 units with breakfast and 10 units with supper only for Premeal blood glucose readings above 90 mg per DL.  She is benefiting from her CGM, advised to continue to wear her CGM at all times. -She is encouraged to call clinic for hypoglycemia below 70 or hyperglycemia above 180 mg per DL x 3.   - Specific targets for  A1c;  LDL, HDL,  and Triglycerides were discussed with the patient.  2) Blood Pressure /Hypertension:  Her blood pressure is controlled to target.  She is currently on amlodipine 5 mg p.o. daily, carvedilol 25 mg p.o. twice daily, losartan 100 mg p.o. daily.  3) Lipids/Hyperlipidemia:   Review of her recent lipid panel showed improved LDL at 75.  She will continue to benefit from low-dose statin, advised to continue atorvastatin 20 mg p.o. daily at bedtime.    Side effects and precautions discussed with her.  4)  Weight/Diet:  Body mass index is 25.01 kg/m.  -She presents with progressive weight loss over the last several months.  She is not a candidate for major weight loss. I discussed with her the fact that loss of 5 - 10% of her  current  body weight will have the most impact on her diabetes management.  Exercise, and detailed carbohydrates information provided  -  detailed on discharge instructions.  She is encouraged to stay close to her nephrologist, PMD and other providers.  5) toxic multinodular goiter: -  She underwent bilateral fine-needle aspiration of nodules on both lobes of the thyroid  with benign findings. She was previously treated with methimazole with incomplete response.  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  She received this treatment  on December 01, 2021.  She remains to euthyroid based on a TSH of 0.98 in July 2025.  She does not have compressive symptoms for now, advised to report if any develop in the future.   6) Chronic Care/Health Maintenance:  -she  is on ACEI/ARB and Statin medications and  is encouraged to initiate and continue to follow up with Ophthalmology, Dentist,  Podiatrist at least yearly or according to recommendations, and advised to   stay away from smoking. I have recommended yearly flu vaccine and pneumonia vaccine at least every 5 years; moderate intensity exercise for up to 150 minutes weekly; and  sleep for at least 7 hours a day.  She did have normal screening point-of-care ABI in December 2022.   This study will be repeated in December 2026, or sooner if needed.  - she is  advised to maintain close follow up with Beach, Jacob C, FNP for primary care needs, as well as her other providers for optimal and coordinated care.   I spent  40  minutes in the care of the patient today including review of labs from CMP, Lipids, Thyroid  Function, Hematology (current and previous including abstractions from other facilities); face-to-face time discussing  her blood glucose readings/logs, discussing hypoglycemia and hyperglycemia episodes and symptoms, medications doses, her options of short and long term treatment based on the latest standards of care / guidelines;  discussion about incorporating lifestyle medicine;  and documenting the encounter. Risk reduction counseling performed per USPSTF guidelines to reduce cardiovascular risk factors.     Please refer to Patient Instructions for Blood Glucose Monitoring and Insulin/Medications Dosing Guide  in media tab for additional information. Please  also refer to  Patient Self Inventory in the Media  tab for reviewed elements of pertinent patient history.  Reena Fontana participated in the discussions, expressed understanding, and voiced agreement with the above  plans.  All questions were answered to her satisfaction. she is encouraged to contact clinic should she have any questions or concerns prior to her return visit.   Follow up plan: - Return in about 4 months (around 02/22/2024) for F/U with Pre-visit Labs, Meter/CGM/Logs, A1c here.  Ranny Earl, MD [facility] [address] [place], [place] [address] Phone: [phone]  Fax: [phone]    10/23/2023, 3:33 PM  This note was partially dictated with voice recognition software. Similar sounding words can be transcribed inadequately or may not  be corrected upon review.

## 2024-02-26 ENCOUNTER — Other Ambulatory Visit: Payer: Self-pay | Admitting: *Deleted

## 2024-02-26 ENCOUNTER — Telehealth: Payer: Self-pay | Admitting: "Endocrinology

## 2024-02-26 DIAGNOSIS — E782 Mixed hyperlipidemia: Secondary | ICD-10-CM

## 2024-02-26 DIAGNOSIS — Z794 Long term (current) use of insulin: Secondary | ICD-10-CM

## 2024-02-26 DIAGNOSIS — I10 Essential (primary) hypertension: Secondary | ICD-10-CM

## 2024-02-26 DIAGNOSIS — E0520 Thyrotoxicosis with toxic multinodular goiter without thyrotoxic crisis or storm: Secondary | ICD-10-CM

## 2024-02-26 DIAGNOSIS — E1122 Type 2 diabetes mellitus with diabetic chronic kidney disease: Secondary | ICD-10-CM

## 2024-02-26 DIAGNOSIS — E0590 Thyrotoxicosis, unspecified without thyrotoxic crisis or storm: Secondary | ICD-10-CM

## 2024-02-26 DIAGNOSIS — E1165 Type 2 diabetes mellitus with hyperglycemia: Secondary | ICD-10-CM

## 2024-02-26 NOTE — Telephone Encounter (Signed)
 Please update labs

## 2024-02-26 NOTE — Telephone Encounter (Signed)
 Labs updated

## 2024-02-28 ENCOUNTER — Ambulatory Visit: Admitting: "Endocrinology

## 2024-04-15 ENCOUNTER — Ambulatory Visit: Admitting: "Endocrinology
